# Patient Record
Sex: Female | Born: 1955 | Hispanic: No | Marital: Married | Smoking: Never smoker
Health system: Southern US, Community
[De-identification: ages and names within clinical notes are randomized; demographics above are authoritative.]

## PROBLEM LIST (undated history)

## (undated) DIAGNOSIS — K219 Gastro-esophageal reflux disease without esophagitis: Secondary | ICD-10-CM

## (undated) DIAGNOSIS — J45909 Unspecified asthma, uncomplicated: Secondary | ICD-10-CM

## (undated) DIAGNOSIS — I10 Essential (primary) hypertension: Secondary | ICD-10-CM

## (undated) HISTORY — DX: Gastro-esophageal reflux disease without esophagitis: K21.9

---

## 2003-09-18 ENCOUNTER — Other Ambulatory Visit: Admission: RE | Admit: 2003-09-18 | Discharge: 2003-09-18 | Payer: Self-pay | Admitting: Gynecology

## 2005-09-22 ENCOUNTER — Other Ambulatory Visit: Admission: RE | Admit: 2005-09-22 | Discharge: 2005-09-22 | Payer: Self-pay | Admitting: Gynecology

## 2007-10-31 ENCOUNTER — Emergency Department (HOSPITAL_COMMUNITY): Admission: EM | Admit: 2007-10-31 | Discharge: 2007-10-31 | Payer: Self-pay | Admitting: Emergency Medicine

## 2008-04-25 ENCOUNTER — Ambulatory Visit: Payer: Self-pay | Admitting: Cardiology

## 2008-05-19 ENCOUNTER — Ambulatory Visit: Payer: Self-pay | Admitting: Cardiology

## 2008-05-19 LAB — CONVERTED CEMR LAB
Chloride: 101 meq/L (ref 96–112)
GFR calc Af Amer: 85 mL/min
GFR calc non Af Amer: 70 mL/min
Hgb A1c MFr Bld: 5.7 % (ref 4.6–6.0)
LDL Cholesterol: 91 mg/dL (ref 0–99)
Potassium: 3.5 meq/L (ref 3.5–5.1)
Sodium: 139 meq/L (ref 135–145)
TSH: 0.86 microintl units/mL (ref 0.35–5.50)
Total CHOL/HDL Ratio: 2.6
VLDL: 17 mg/dL (ref 0–40)

## 2008-05-22 ENCOUNTER — Encounter: Payer: Self-pay | Admitting: Cardiology

## 2008-05-22 ENCOUNTER — Ambulatory Visit: Payer: Self-pay

## 2009-12-26 ENCOUNTER — Encounter: Payer: Self-pay | Admitting: Gastroenterology

## 2009-12-30 ENCOUNTER — Telehealth: Payer: Self-pay | Admitting: Gastroenterology

## 2009-12-30 ENCOUNTER — Ambulatory Visit: Payer: Self-pay | Admitting: Gastroenterology

## 2009-12-30 DIAGNOSIS — K921 Melena: Secondary | ICD-10-CM | POA: Insufficient documentation

## 2009-12-30 DIAGNOSIS — D509 Iron deficiency anemia, unspecified: Secondary | ICD-10-CM | POA: Insufficient documentation

## 2010-01-01 LAB — CONVERTED CEMR LAB
Ferritin: 4.6 ng/mL — ABNORMAL LOW (ref 10.0–291.0)
Transferrin: 349.5 mg/dL (ref 212.0–360.0)
Vitamin B-12: 513 pg/mL (ref 211–911)

## 2010-01-21 ENCOUNTER — Encounter: Admission: RE | Admit: 2010-01-21 | Discharge: 2010-01-21 | Payer: Self-pay | Admitting: Allergy and Immunology

## 2010-06-08 NOTE — Letter (Signed)
Summary: [facility]  [facility]   Imported By: Sherian Rein 01/04/2010 09:28:17  _____________________________________________________________________  External Attachment:    Type:   Image     Comment:   External Document

## 2010-06-08 NOTE — Letter (Signed)
Summary: [facility] Instructions  [facility]  [address] [place], [place] [address]   Phone: [phone]  Fax: [phone]       Lori [facility]    Apr 08, 1969    MRN: [id number]        Procedure Day /Date: Friday August 26th, 2011     Arrival Time: 10:30am     Procedure Time: 11:30am     Location of Procedure:                    _ x_  [facility] (4th Floor)                        PREPARATION FOR COLONOSCOPY WITH MOVIPREP   Starting 5 days prior to your procedure today do not eat nuts, seeds, popcorn, corn, beans, peas,  salads, or any raw vegetables.  Do not take any fiber supplements (e.g. Metamucil, Citrucel, and Benefiber).  THE DAY BEFORE YOUR PROCEDURE         DATE: 12/31/09  DAY: Thursday  1.  Drink clear liquids the entire day-NO SOLID FOOD  2.  Do not drink anything colored red or purple.  Avoid juices with pulp.  No orange juice.  3.  Drink at least 64 oz. (8 glasses) of fluid/clear liquids during the day to prevent dehydration and help the prep work efficiently.  CLEAR LIQUIDS INCLUDE: Water Jello Ice Popsicles Tea (sugar ok, no milk/cream) Powdered fruit flavored drinks Coffee (sugar ok, no milk/cream) Gatorade Juice: apple, white grape, white cranberry  Lemonade Clear bullion, consomm, broth Carbonated beverages (any kind) Strained chicken noodle soup Hard Candy                             4.  In the morning, mix first dose of MoviPrep solution:    Empty 1 Pouch A and 1 Pouch B into the disposable container    Add lukewarm drinking water to the top line of the container. Mix to dissolve    Refrigerate (mixed solution should be used within 24 hrs)  5.  Begin drinking the prep at 5:00 p.m. The MoviPrep container is divided by 4 marks.   Every 15 minutes drink the solution down to the next mark (approximately 8 oz) until the full liter is complete.   6.  Follow completed prep with 16 oz of clear liquid of your  choice (Nothing red or purple).  Continue to drink clear liquids until bedtime.  7.  Before going to bed, mix second dose of MoviPrep solution:    Empty 1 Pouch A and 1 Pouch B into the disposable container    Add lukewarm drinking water to the top line of the container. Mix to dissolve    Refrigerate  THE DAY OF YOUR PROCEDURE      DATE: 01/01/10 DAY: Friday  Beginning at 6:30 a.m. (5 hours before procedure):         1. Every 15 minutes, drink the solution down to the next mark (approx 8 oz) until the full liter is complete.  2. Follow completed prep with 16 oz. of clear liquid of your choice.    3. You may drink clear liquids until 9:30am (2 HOURS BEFORE PROCEDURE).   MEDICATION INSTRUCTIONS  Unless otherwise instructed, you should take regular prescription medications with a small sip of water   as early as possible the morning of your  procedure.       OTHER INSTRUCTIONS  You will need a responsible adult at least 53 years of age to accompany you and drive you home.   This person must remain in the waiting room during your procedure.  Wear loose fitting clothing that is easily removed.  Leave jewelry and other valuables at home.  However, you Schlotter wish to bring a book to read or  an iPod/MP3 player to listen to music as you wait for your procedure to start.  Remove all body piercing jewelry and leave at home.  Total time from sign-in until discharge is approximately 2-3 hours.  You should go home directly after your procedure and rest.  You can resume normal activities the  day after your procedure.  The day of your procedure you should not:   Drive   Make legal decisions   Operate machinery   Drink alcohol   Return to work  You will receive specific instructions about eating, activities and medications before you leave.    The above instructions have been reviewed and explained to me by   Marchelle Folks.     I fully understand and can verbalize these  instructions _____________________________ Date _________

## 2010-06-08 NOTE — Assessment & Plan Note (Signed)
Summary: BLOOD IN STOOL...AS.   History of Present Illness Visit Type: Initial Consult Primary GI MD: Elie Goody MD [facility] Primary Yul Diana: Lissa Merlin, MD Requesting Loretta Kluender: Lissa Merlin, MD Chief Complaint: Patient c/o increased belching and bloating which began on Friday. Also on Friday, patient began having nausea while having bowel movements. She has had several episodes of diarrhea and small amounts of brbpr. There was some mucous in her stool. History of Present Illness:   This is a 55 year old female, who noted the acute onset of blood and diarrhea last Friday. She had several episodes of diarrhea, followed by bloody diarrhea. Her symptoms abated after about 48 hours. She notes mid abdominal cramping with a bowel movement last night and that the bleeding and diarrhea have stopped. She was seen in an urgent care center with a hemoglobin of 11.7, and an MCV of 74. She frequently chew ice.  She is maintained on Dexilant for control of acid reflux, which appears to be quite effectively controlled, and this is followed by Dr. Sharyn Lull. She does not have a regular primary physician.   GI Review of Systems    Reports acid reflux, belching, bloating, and  nausea.      Denies abdominal pain, chest pain, dysphagia with liquids, dysphagia with solids, heartburn, loss of appetite, vomiting, vomiting blood, weight loss, and  weight gain.      Reports change in bowel habits, diarrhea, and  rectal bleeding.     Denies anal fissure, black tarry stools, constipation, diverticulosis, fecal incontinence, heme positive stool, hemorrhoids, irritable bowel syndrome, jaundice, light color stool, liver problems, and  rectal pain. Preventive Screening-Counseling & Management  Alcohol-Tobacco     Smoking Status: quit  Caffeine-Diet-Exercise     Does Patient Exercise: no   Current Medications (verified): 1)  Alvesco 160 Mcg/act Aers (Ciclesonide) .... One Puff Two Times A Day 2)  Singulair  10 Mg Tabs (Montelukast Sodium) .... One Tablet By Mouth Once Daily 3)  Amlodipine Besylate 5 Mg Tabs (Amlodipine Besylate) .... One Tablet By Mouth Once Daily 4)  Vitamin C 1000 Mg Tabs (Ascorbic Acid) .... Take 1 Tablet By Mouth Once A Day 5)  Fish Oil 1000 Mg Caps (Omega-3 Fatty Acids) .... Take 1 Capsule By Mouth Once Daily 6)  Calcium-Magnesium-Zinc 333-133-8.3 Mg Tabs (Calcium-Magnesium-Zinc) .... Take 1 Tablet By Mouth Once A Day 7)  Dexilant 60 Mg Cpdr (Dexlansoprazole) .... One Tablet By Mouth Once Daily  Allergies (verified): No Known Drug Allergies  Past History:  Past Medical History: Asthma Obesity Hypertension Seasonal Allergies GERD  Past Surgical History: Unremarkable  Family History: No FH of Colon Cancer: Family History of Heart Disease: Grandmother  Social History: Alcohol Use - no Illicit Drug Use - no Patient is a former smoker. -stopped 35 years ago Patient does not get regular exercise.  Smoking Status:  quit Does Patient Exercise:  no  Review of Systems       The patient complains of sleeping problems.         The pertinent positives and negatives are noted as above and in the HPI. All other ROS were reviewed and were negative.  Vital Signs:  Patient profile:   55 year old female Height:      66 inches Weight:      233.13 pounds BMI:     37.76 BSA:     2.14 Pulse rate:   72 / minute Pulse rhythm:   regular BP sitting:   126 / 72  (  right arm) Cuff size:   regular  Vitals Entered By: Lamona Curl CMA Duncan Dull) (December 30, 2009 10:28 AM)  Physical Exam  General:  Well developed, well nourished, no acute distress. Obese.   Head:  Normocephalic and atraumatic. Eyes:  PERRLA, no icterus. Ears:  Normal auditory acuity. Mouth:  No deformity or lesions, dentition normal. Neck:  Supple; no masses or thyromegaly. Lungs:  Clear throughout to auscultation. Heart:  Regular rate and rhythm; no murmurs, rubs,  or bruits. Abdomen:  Soft,  nontender and nondistended. No masses, hepatosplenomegaly or hernias noted. Normal bowel sounds. Rectal:  deferred until time of colonoscopy.   Msk:  Symmetrical with no gross deformities. Normal posture. Pulses:  Normal pulses noted. Extremities:  No clubbing, cyanosis, edema or deformities noted. Neurologic:  Alert and  oriented x4;  grossly normal neurologically. Cervical Nodes:  No significant cervical adenopathy. Inguinal Nodes:  No significant inguinal adenopathy. Psych:  Alert and cooperative. Normal mood and affect.  Impression & Recommendations:  Problem # 1:  BLOOD IN STOOL (ICD-578.1) Acute, self-limited bloody diarrhea. I suspect she had an infectious or ischemic colitis. Need to exclude colorectal neoplasms and IBD. She has not previously had colonoscopy for colorectal cancer screening. The risks, benefits and alternatives to colonoscopy with possible biopsy and possible polypectomy were discussed with the patient and they consent to proceed. The procedure will be scheduled electively. Orders: TLB-Iron, (Fe) Total (83540-FE) TLB-B12, Serum-Total ONLY (13086-V78) TLB-Ferritin (82728-FER) TLB-Folic Acid (Folate) (82746-FOL) TLB-IBC Pnl (Iron/FE;Transferrin) (83550-IBC) Colonoscopy (Colon)  Problem # 2:  ANEMIA, IRON DEFICIENCY (ICD-280.9) Microcytic anemia. Presumed iron deficiency. Rule out chronic gastrointestinal losses versus other causes. Plan as above.  Patient Instructions: 1)  Get your labs drawn today in the basement. 2)  Colonoscopy brochure given.  3)  Copy sent to : Lissa Merlin, MD 4)  The medication list was reviewed and reconciled.  All changed / newly prescribed medications were explained.  A complete medication list was provided to the patient / caregiver.  Prescriptions: MOVIPREP 100 GM  SOLR (PEG-KCL-NACL-NASULF-NA ASC-C) As per prep instructions.  #1 x 0   Entered by:   Christie Nottingham CMA (AAMA)   Authorized by:   Meryl Dare MD [facility]   Signed  by:   Meryl Dare MD [facility] on 12/30/2009   Method used:   Electronically to        [facility] (retail)       [address]       [place], [place]  [address]       Ph: [phone]       Fax: [phone]   RxID:   [id number]

## 2010-06-08 NOTE — Progress Notes (Signed)
Summary: cx fee?  Phone Note Call from Patient   Caller: Patient Call For: Dr. Russella Dar Summary of Call: pt came into office today to cancel COL scheduled for this Friday... reason being she doesn't have enough money to pay for the procedure... pt did not reschedule at this time Dr. Russella Dar, do you wish to charge pt a cx fee? Initial call taken by: Vallarie Mare,  December 30, 2009 11:33 AM  Follow-up for Phone Call        No Cx fee since she just scheduled today. Follow-up by: Meryl Dare MD Clementeen Graham,  December 30, 2009 11:36 AM  Additional Follow-up for Phone Call Additional follow up Details #1::        Patient NOT BILLED. Additional Follow-up by: Leanor Kail [facility],  December 31, 2009 8:53 AM

## 2010-08-11 ENCOUNTER — Ambulatory Visit: Payer: Self-pay | Admitting: Cardiovascular Disease

## 2010-09-21 NOTE — Procedures (Signed)
[facility]                              EXERCISE Zada Girt   Lori Newton, Lori Newton                   MRN:          [id number]  DATE:05/19/2008                            DOB:          1955/06/11    ASTHMA/ALLERGY SPECIALIST:  Jessica Priest, MD   INDICATION:  Chest pain.   EXERCISE TEST PROCEDURE:  The patient exercised according to the  standard Bruce protocol for 6 minutes and 20 seconds.  She achieved a  work level of 7.50 METS, resting heart rate 103 beats per minute rose to  maximal heart rate of 164 beats per minute, this represents 96% of the  maximal age predicted heart rate.  The resting blood pressure 116/60  reached a maximum pressure of 167/73.  The exercise test was stopped due  to shortness of breath.  There was no chest pain.  Evaluation of the  exercise EKG showed no significant ST-segment changes.  There were 2  brief runs of 3 beats of nonsustained ventricular tachycardia.   CONCLUSIONS:  Below normal exercise tolerance for age.  No evidence for  ischemia on the exercise EKG.  Mild amount of ventricular ectopy.  We  will be doing a Holter monitor and an echocardiogram on this patient.     Marca Ancona, MD  Electronically Signed    DM/MedQ  DD: 05/19/2008  DT: 05/20/2008  Job #: [id number]   cc:   Jessica Priest, M.D.

## 2010-09-21 NOTE — Assessment & Plan Note (Signed)
[facility]                            CARDIOLOGY OFFICE NOTE   Lori Newton, Lori Newton                   MRN:          [id number]  DATE:04/25/2008                            DOB:          March 30, 1956    REFERRING PHYSICIAN:  Jessica Priest, MD with [facility]  [place].   HISTORY OF PRESENT ILLNESS:  This is a 55 year old with a history of  asthma and gastroesophageal reflux disease, who presents for evaluation  of chest pain.  The patient states that she has had episodes of chest  pain periodically in the past.  Once in September 2009, she went the  emergency department with chest discomfort, was watched over a few hours  and then sent home and told that they did not think she had any heart  problems.  Over the last 2 weeks, she has began to get chest pain again.  She states that it feels like pressure is substernal and it is not  associated with exertion, and most often comes on when she is not doing  anything.  She has occasional episodes daily.  The episodes last only 2-  3 minutes and then resolves spontaneously.  She describes them as mild-  to-moderate in intensity.  These episodes she states are not associated  with either wheezing or shortness of breath.  She states typically when  she has asthma flares, she does not get chest pain, unless she is  coughing a lot.  She has not been coughing a lot over the last couple of  weeks.  Typically with asthma, she gets shortness of breath and wheezing  without having chest pain.  Therefore, she feels like this is somewhat  different than her usual pattern.  Additionally, the patient does have  gastroesophageal reflux disease, but she states that the reflux symptoms  that she gets tend to be more epigastric and associated with eating.  The patient has been walking for exercise lately.  She just started  doing this and she states that she does not get any particular chest  pain  when she walks for exercise.  The patient is premenopausal.  The  patient also sometimes feel always like her heart slows down too much  and when that happens she feels like her heart beats extremely hard.  She does not get lightheaded with these episodes and has not had any  syncope.   PAST MEDICAL HISTORY:  1. Asthma.  2. Allergic rhinitis.  3. Gastroesophageal reflux disease.  This is all to contribute to her      asthma.  4. Obesity.   MEDICATIONS:  1. Advair 500/50 b.i.d.  2. Nexium 40 b.i.d.  3. Albuterol p.r.n.   EKG was reviewed today shows normal sinus rhythm and is a normal EKG.   SOCIAL HISTORY:  The patient quit smoking 29 years ago.  She does not  use any significant alcohol or illicit drugs.   FAMILY HISTORY:  The patient has no history of premature coronary artery  disease.  There is entirely no history of MI in her family.  The  patient's father  died of bone cancer.  Mother is alive and very healthy.   REVIEW OF SYSTEMS:  Negative except as noted in the history of present  illness.   PHYSICAL EXAMINATION:  VITAL SIGNS:  Blood pressure is 131/90, heart  rate is 85 and regular, and weight is 227 pounds.  GENERAL:  This is a obese female and in no apparent distress.  NEUROLOGICAL:  Alert and oriented x3.  Normal affect.  HEENT:  Normal.  NECK:  There is no JVD.  There is no thyromegaly or thyroid nodule.  LUNGS:  Clear to auscultation bilaterally with normal respiratory  effort.  There is no wheezing.  CARDIOVASCULAR:  Heart regular.  S1 and S2.  No S3.  No S4.  There is no  murmur.  There is no peripheral edema.  There are 2+ posterior tibial  pulses bilaterally.  There is no carotid bruit.  ABDOMEN:  Obese, soft, and nontender.  No hepatosplenomegaly.  Normal  bowel sounds.  EXTREMITIES:  No clubbing or cyanosis.  MUSCULOSKELETAL:  Normal exam.  SKIN:  Normal exam.   ASSESSMENT AND PLAN:  This is a 54 year old with history of asthma and  gastroesophageal  reflux disease, who presents for evaluation of chest  pain.  1. Chest pain.  The patient's chest pain symptoms are quite atypical.      She really does not appear to have any significant risk factors for      coronary disease.  Her blood pressure is borderline at 131/90, but      I do not think that she actually has the true diagnosis of      hypertension.  She is a nonsmoker.  We do not know what her      cholesterol is.  She has no family history of premature coronary      artery disease.  She is premenopausal.  Her chest pain may very      well be function of either her asthma or her gastroesophageal      reflux disease.  She does not remember using her albuterol inhaler      during an episode of chest pain in the past, so she is not sure      that if using albuterol inhaler would actually help it.  I did tell      the patient to try using her albuterol when she has a chest pain      episode.  She has a normal EKG.  We will go ahead and do an      exercise treadmill test with no other imaging to make sure there is      no evidence for ischemia.  We will also check her cholesterol and      her hemoglobin A1c today.  2. Slow heart rate.  The patient feels like her heart rate slows down      too much of times.  She is not really symptomatic other than      feeling palpitations.  Her heart rate is in the 80s today.  Given      the fact that this is worrying her, I will set her up with a 48-      hour Holter monitor to make sure she does not have any significant      dysrhythmias.  We will also check a TSH.     Marca Ancona, MD  Electronically Signed    DM/MedQ  DD: 04/25/2008  DT: 04/26/2008  Job #: [id number]  cc:   Jessica Priest, M.D.

## 2010-11-28 ENCOUNTER — Emergency Department (HOSPITAL_COMMUNITY)
Admission: EM | Admit: 2010-11-28 | Discharge: 2010-11-29 | Disposition: A | Discharge status: Home / Self Care | Payer: BC Managed Care – PPO | Attending: Emergency Medicine | Admitting: Emergency Medicine

## 2010-11-28 DIAGNOSIS — K219 Gastro-esophageal reflux disease without esophagitis: Secondary | ICD-10-CM | POA: Insufficient documentation

## 2010-11-28 DIAGNOSIS — R42 Dizziness and giddiness: Secondary | ICD-10-CM | POA: Insufficient documentation

## 2010-11-28 DIAGNOSIS — R5383 Other fatigue: Secondary | ICD-10-CM | POA: Insufficient documentation

## 2010-11-28 DIAGNOSIS — I10 Essential (primary) hypertension: Secondary | ICD-10-CM | POA: Insufficient documentation

## 2010-11-28 DIAGNOSIS — Z79899 Other long term (current) drug therapy: Secondary | ICD-10-CM | POA: Insufficient documentation

## 2010-11-28 DIAGNOSIS — R5381 Other malaise: Secondary | ICD-10-CM | POA: Insufficient documentation

## 2010-11-28 DIAGNOSIS — J45909 Unspecified asthma, uncomplicated: Secondary | ICD-10-CM | POA: Insufficient documentation

## 2010-11-28 DIAGNOSIS — R002 Palpitations: Secondary | ICD-10-CM | POA: Insufficient documentation

## 2010-11-28 LAB — POCT I-STAT, CHEM 8
Calcium, Ion: 1.19 mmol/L (ref 1.12–1.32)
Chloride: 104 mEq/L (ref 96–112)
Creatinine, Ser: 0.9 mg/dL (ref 0.50–1.10)
Glucose, Bld: 87 mg/dL (ref 70–99)
Hemoglobin: 13.6 g/dL (ref 12.0–15.0)
Potassium: 3.9 mEq/L (ref 3.5–5.1)

## 2010-11-28 LAB — DIFFERENTIAL
Basophils Absolute: 0 10*3/uL (ref 0.0–0.1)
Eosinophils Relative: 1 % (ref 0–5)
Lymphocytes Relative: 29 % (ref 12–46)
Lymphs Abs: 1.5 10*3/uL (ref 0.7–4.0)
Monocytes Absolute: 0.5 10*3/uL (ref 0.1–1.0)
Monocytes Relative: 10 % (ref 3–12)
Neutro Abs: 3.2 10*3/uL (ref 1.7–7.7)

## 2010-11-28 LAB — CBC
HCT: 38.3 % (ref 36.0–46.0)
Hemoglobin: 12.8 g/dL (ref 12.0–15.0)
MCHC: 33.4 g/dL (ref 30.0–36.0)
MCV: 86.7 fL (ref 78.0–100.0)

## 2010-12-06 ENCOUNTER — Ambulatory Visit: Payer: Self-pay | Admitting: Internal Medicine

## 2010-12-08 ENCOUNTER — Emergency Department (HOSPITAL_COMMUNITY)
Admission: EM | Admit: 2010-12-08 | Discharge: 2010-12-08 | Disposition: A | Discharge status: Home / Self Care | Payer: BC Managed Care – PPO | Attending: Emergency Medicine | Admitting: Emergency Medicine

## 2010-12-08 DIAGNOSIS — I10 Essential (primary) hypertension: Secondary | ICD-10-CM | POA: Insufficient documentation

## 2010-12-08 DIAGNOSIS — Z79899 Other long term (current) drug therapy: Secondary | ICD-10-CM | POA: Insufficient documentation

## 2011-02-03 LAB — POCT I-STAT, CHEM 8
Calcium, Ion: 1.16
Chloride: 104
Creatinine, Ser: 0.9
Glucose, Bld: 90
Potassium: 3.7

## 2011-02-03 LAB — POCT CARDIAC MARKERS
CKMB, poc: 1.6
Troponin i, poc: <0.05

## 2012-02-01 ENCOUNTER — Emergency Department (HOSPITAL_COMMUNITY)
Admission: EM | Admit: 2012-02-01 | Discharge: 2012-02-01 | Disposition: A | Discharge status: Home / Self Care | Payer: BC Managed Care – PPO | Attending: Emergency Medicine | Admitting: Emergency Medicine

## 2012-02-01 ENCOUNTER — Encounter (HOSPITAL_COMMUNITY): Payer: Self-pay | Admitting: Emergency Medicine

## 2012-02-01 DIAGNOSIS — R5381 Other malaise: Secondary | ICD-10-CM | POA: Insufficient documentation

## 2012-02-01 DIAGNOSIS — Z9109 Other allergy status, other than to drugs and biological substances: Secondary | ICD-10-CM | POA: Insufficient documentation

## 2012-02-01 DIAGNOSIS — R531 Weakness: Secondary | ICD-10-CM

## 2012-02-01 DIAGNOSIS — I10 Essential (primary) hypertension: Secondary | ICD-10-CM | POA: Insufficient documentation

## 2012-02-01 DIAGNOSIS — Z79899 Other long term (current) drug therapy: Secondary | ICD-10-CM | POA: Insufficient documentation

## 2012-02-01 DIAGNOSIS — J45909 Unspecified asthma, uncomplicated: Secondary | ICD-10-CM | POA: Insufficient documentation

## 2012-02-01 HISTORY — DX: Unspecified asthma, uncomplicated: J45.909

## 2012-02-01 HISTORY — DX: Essential (primary) hypertension: I10

## 2012-02-01 LAB — URINALYSIS, ROUTINE W REFLEX MICROSCOPIC
Leukocytes, UA: NEGATIVE
Nitrite: NEGATIVE
Specific Gravity, Urine: 1.007 (ref 1.005–1.030)
Urobilinogen, UA: 0.2 mg/dL (ref 0.0–1.0)

## 2012-02-01 LAB — CBC WITH DIFFERENTIAL/PLATELET
Basophils Absolute: 0 10*3/uL (ref 0.0–0.1)
Basophils Relative: 0 % (ref 0–1)
Eosinophils Absolute: 0.1 10*3/uL (ref 0.0–0.7)
Eosinophils Relative: 1 % (ref 0–5)
HCT: 41.7 % (ref 36.0–46.0)
Hemoglobin: 14.4 g/dL (ref 12.0–15.0)
Lymphocytes Relative: 34 % (ref 12–46)
Lymphs Abs: 1.8 10*3/uL (ref 0.7–4.0)
MCH: 30.3 pg (ref 26.0–34.0)
MCHC: 34.5 g/dL (ref 30.0–36.0)
MCV: 87.8 fL (ref 78.0–100.0)
Monocytes Absolute: 0.5 10*3/uL (ref 0.1–1.0)
Monocytes Relative: 9 % (ref 3–12)
Neutro Abs: 3 10*3/uL (ref 1.7–7.7)
Neutrophils Relative %: 56 % (ref 43–77)
Platelets: 341 10*3/uL (ref 150–400)
RBC: 4.75 MIL/uL (ref 3.87–5.11)
RDW: 13 % (ref 11.5–15.5)
WBC: 5.5 10*3/uL (ref 4.0–10.5)

## 2012-02-01 LAB — BASIC METABOLIC PANEL
CO2: 28 mEq/L (ref 19–32)
GFR calc non Af Amer: 78 mL/min — ABNORMAL LOW (ref >90)
Glucose, Bld: 99 mg/dL (ref 70–99)
Potassium: 3.6 mEq/L (ref 3.5–5.1)
Sodium: 138 mEq/L (ref 135–145)

## 2012-02-01 NOTE — ED Provider Notes (Signed)
History     CSN: [id number]  Arrival date & time 02/01/12  1357   First MD Initiated Contact with Patient 02/01/12 1704      Chief Complaint  Patient presents with  . Weakness    (Consider location/radiation/quality/duration/timing/severity/associated sxs/prior treatment) HPI... weakness upon awakening from a nap this afternoon .  Her legs felt heavy, she was weak and dizzy. Had only eaten bag of potato chips earlier today. Feels back to normal now. No chest pain, shortness of breath, neurological deficits.  Severity is mild. Nothing makes symptoms better or worse  Past Medical History  Diagnosis Date  . Hypertension   . Asthma     History reviewed. No pertinent past surgical history.  History reviewed. No pertinent family history.  History  Substance Use Topics  . Smoking status: Never Smoker   . Smokeless tobacco: Not on file  . Alcohol Use: Yes    OB History    Grav Para Term Preterm Abortions TAB SAB Ect Mult Living                  Review of Systems  All other systems reviewed and are negative.    Allergies  Betadine  Home Medications   Current Outpatient Rx  Name Route Sig Dispense Refill  . ALBUTEROL SULFATE HFA 108 (90 BASE) MCG/ACT IN AERS Inhalation Inhale 2 puffs into the lungs every 6 (six) hours as needed. wheezing    . BECLOMETHASONE DIPROPIONATE 80 MCG/ACT IN AERS Inhalation Inhale 2 puffs into the lungs as needed.    Marland Kitchen CALCIUM CARBONATE 1250 MG PO CHEW Oral Chew 1 tablet by mouth daily.    Marland Kitchen VITAMIN D 1000 UNITS PO TABS Oral Take 1,000 Units by mouth daily.    . DEXLANSOPRAZOLE 60 MG PO CPDR Oral Take 60 mg by mouth daily.    Marland Kitchen DILTIAZEM HCL ER 240 MG PO CP24 Oral Take 240 mg by mouth daily.    Marland Kitchen HYDROCHLOROTHIAZIDE 12.5 MG PO TABS Oral Take 12.5 mg by mouth daily.      BP 134/78  Pulse 87  Temp 98.2 F (36.8 C) (Oral)  Resp 20  SpO2 97%  LMP 01/01/2011  Physical Exam  Nursing note and vitals reviewed. Constitutional: She is  oriented to person, place, and time. She appears well-developed and well-nourished.  HENT:  Head: Normocephalic and atraumatic.  Eyes: Conjunctivae normal and EOM are normal. Pupils are equal, round, and reactive to light.  Neck: Normal range of motion. Neck supple.  Cardiovascular: Normal rate, regular rhythm and normal heart sounds.   Pulmonary/Chest: Effort normal and breath sounds normal.  Abdominal: Soft. Bowel sounds are normal.  Musculoskeletal: Normal range of motion.  Neurological: She is alert and oriented to person, place, and time.  Skin: Skin is warm and dry.  Psychiatric: She has a normal mood and affect.    ED Course  Procedures (including critical care time)  Labs Reviewed  BASIC METABOLIC PANEL - Abnormal; Notable for the following:    GFR calc non Af Amer 78 (*)     All other components within normal limits  URINALYSIS, ROUTINE W REFLEX MICROSCOPIC  CBC WITH DIFFERENTIAL   No results found.   1. Weakness       MDM  Normal physical exam. Labs, urinalysis normal. No evidence of neurological injury        Donnetta Hutching, MD 02/01/12 1844

## 2012-02-01 NOTE — ED Notes (Signed)
Pt states that she normally takes a nap everyday but today when she woke up she felt lightheaded and that her legs were heavy.  Denies any pain.  No other complaints.  VSS.

## 2015-03-03 ENCOUNTER — Ambulatory Visit (INDEPENDENT_AMBULATORY_CARE_PROVIDER_SITE_OTHER): Payer: 59 | Admitting: Allergy and Immunology

## 2015-03-03 ENCOUNTER — Encounter: Payer: Self-pay | Admitting: Allergy and Immunology

## 2015-03-03 VITALS — BP 142/92 | HR 68 | Resp 16

## 2015-03-03 DIAGNOSIS — J4530 Mild persistent asthma, uncomplicated: Secondary | ICD-10-CM

## 2015-03-03 DIAGNOSIS — K219 Gastro-esophageal reflux disease without esophagitis: Secondary | ICD-10-CM

## 2015-03-03 DIAGNOSIS — J3089 Other allergic rhinitis: Secondary | ICD-10-CM

## 2015-03-03 MED ORDER — ALBUTEROL SULFATE 108 (90 BASE) MCG/ACT IN AEPB: 2.0000 | INHALATION_SPRAY | RESPIRATORY_TRACT | Status: DC | PRN

## 2015-03-03 NOTE — Progress Notes (Signed)
[facility]  Follow-up Note  Refering Provider: No ref. provider found PCP: Farris Has, MD  Subjective:   Lori Newton is a 59 y.o. female who [facility] the [facility] in re-evaluation of the following:  HPI Comments:  Lori Newton returns to this clinic after a hiatus of 2-1/2 years. She is done very well with her asthma. She's had no exacerbations requiring her to get steroids. She's had no hospitalizations. She has nocturnal bronchospastic symptoms one time per month. Otherwise she has no need to use a short-acting bronchodilator. She is using Qvar 80 one inhalation in the morning and 2 inhalations in the evening and continues on montelukast 10 mg daily. She finds that when she stops montelukast she has more problems. Her nose has not been causing her problem. She has not had any reflux issues. On a rare occasion, averaging out one time per month, she will take apple cider vinegar to treat a reflux event.   Outpatient Prescriptions Prior to Visit  Medication Sig  . calcium carbonate (OS-CAL) 1250 MG chewable tablet Chew 1 tablet by mouth daily.  . cholecalciferol (VITAMIN D) 1000 UNITS tablet Take 1,000 Units by mouth daily.  Marland Kitchen diltiazem (DILACOR XR) 240 MG 24 hr capsule Take 240 mg by mouth daily.  Marland Kitchen albuterol (PROVENTIL HFA;VENTOLIN HFA) 108 (90 BASE) MCG/ACT inhaler Inhale 2 puffs into the lungs every 6 (six) hours as needed. wheezing  . dexlansoprazole (DEXILANT) 60 MG capsule Take 60 mg by mouth daily.  . hydrochlorothiazide (HYDRODIURIL) 12.5 MG tablet Take 12.5 mg by mouth daily.   No facility-administered medications prior to visit.    Meds ordered this encounter  Medications  . Albuterol Sulfate (PROAIR RESPICLICK) 108 (90 BASE) MCG/ACT AEPB    Sig: Inhale 2 Doses into the lungs every 4 (four) hours as needed.    Dispense:  1 each    Refill:  1    Pt has coupon for free drug    Past Medical  History  Diagnosis Date  . Hypertension   . Asthma   . GERD (gastroesophageal reflux disease)     History reviewed. No pertinent past surgical history.  Allergies  Allergen Reactions  . Betadine [Povidone Iodine] Rash    Review of Systems  Constitutional: Negative for fever, chills, weight loss and malaise/fatigue.  HENT: Negative for congestion, ear discharge, ear pain, hearing loss, nosebleeds, sore throat and tinnitus.   Eyes: Negative for blurred vision, pain, discharge and redness.  Respiratory: Negative for cough, hemoptysis, sputum production, shortness of breath, wheezing and stridor.   Cardiovascular: Negative for chest pain, palpitations and leg swelling.  Gastrointestinal: Negative for heartburn, nausea, vomiting, abdominal pain and diarrhea.  Genitourinary: Negative for dysuria.  Musculoskeletal: Positive for joint pain. Negative for myalgias.       Lori Newton has developed problems with right hip pain, left shoulder pain, and foot pain. These have been long-standing issues but may be a little bit worse the past year or so. She is scheduled to see a foot doctor  Skin: Negative for itching and rash.  Neurological: Negative for dizziness and headaches.     Objective:   Filed Vitals:   03/03/15 1109  BP: 142/92  Pulse: 68  Resp: 16          Physical Exam  Constitutional: She is well-developed, well-nourished, and in no distress. No distress.  HENT:  Head: Normocephalic and atraumatic. Head is without right periorbital  erythema and without left periorbital erythema.  Right Ear: Tympanic membrane, external ear and ear canal normal. No drainage. No foreign bodies. Tympanic membrane is not injected, not scarred, not perforated, not erythematous, not retracted and not bulging. No middle ear effusion.  Left Ear: Tympanic membrane, external ear and ear canal normal. No drainage. No foreign bodies. Tympanic membrane is not injected, not scarred, not perforated, not  erythematous, not retracted and not bulging.  No middle ear effusion.  Nose: Nose normal. No mucosal edema, rhinorrhea, nose lacerations, sinus tenderness, nasal deformity, septal deviation or nasal septal hematoma. No epistaxis.  Mouth/Throat: Oropharynx is clear and moist and mucous membranes are normal. No oropharyngeal exudate, posterior oropharyngeal edema, posterior oropharyngeal erythema or tonsillar abscesses.  Eyes: Conjunctivae and lids are normal. Pupils are equal, round, and reactive to light. Right eye exhibits no discharge and no exudate. No foreign body present in the right eye. Left eye exhibits no discharge and no exudate. No foreign body present in the left eye. Right conjunctiva is not injected. Right conjunctiva has no hemorrhage. Left conjunctiva is not injected. Left conjunctiva has no hemorrhage. No scleral icterus.  Neck: No tracheal tenderness present. No tracheal deviation present. No thyromegaly present.  Cardiovascular: Normal rate, regular rhythm, S1 normal, S2 normal and normal heart sounds.  Exam reveals no gallop and no friction rub.   No murmur heard. Pulmonary/Chest: Effort normal. No stridor. No respiratory distress. She has no wheezes. She has no rhonchi. She has no rales. She exhibits no tenderness.  Musculoskeletal: She exhibits no edema or tenderness.  Lymphadenopathy:    She has no cervical adenopathy.  Skin: No purpura and no rash noted. Rash is not macular, not maculopapular, not nodular, not pustular, not vesicular and not urticarial. She is not diaphoretic. No cyanosis or erythema. No pallor. Nails show no clubbing.  Psychiatric: Mood, affect and judgment normal.    Diagnostics:    Spirometry was performed and demonstrated an FEV1 of 1.97 at 90 % of predicted.  The patient had an Asthma Control Test with the following results: ACT Total Score: 21.    Assessment and Plan:   1. Mild persistent asthma, uncomplicated   2. Other allergic rhinitis   3.  Gastroesophageal reflux disease, esophagitis presence not specified      1. Qvar 80 one puff two times per day. Can increase to 3 puffs three times per day during 'asthma flare'  2. Continue montelukast 10mg  one tablet one time per day  3. Use Proair Respiclick (with coupon) 2 puffs every 4-6 hours if needed.  4. Return in one year   Lori Newton is done excellent. I see no need for changing her medical therapy other than to attempt a decrease in her Qvar dose. She will keep in contact with me noting her response to the therapy mentioned above. If she does well I will see her in his clinic in 1 year   Laurette SchimkeEric Shashwat Cleary, MD [facility]

## 2015-03-03 NOTE — Patient Instructions (Signed)
  1. Qvar 80 one puff two times per day. Can increase to 3 puffs three times per day during 'asthma flare'  2. Continue montelukast 10mg  one tablet one time per day  3. Use Proair Respiclick (with coupon) 2 puffs every 4-6 hours if needed.  4. Return in one year

## 2016-08-29 ENCOUNTER — Other Ambulatory Visit: Payer: Self-pay | Admitting: Family Medicine

## 2016-08-29 DIAGNOSIS — E049 Nontoxic goiter, unspecified: Secondary | ICD-10-CM

## 2016-09-05 ENCOUNTER — Other Ambulatory Visit: Payer: BC Managed Care – PPO

## 2019-02-26 LAB — CBC AND DIFFERENTIAL
HCT: 39 (ref 36–46)
Hemoglobin: 13.3 (ref 12.0–16.0)
Platelets: 341 (ref 150–399)

## 2019-02-26 LAB — TSH: TSH: 1.01 (ref <=5.90)

## 2019-02-26 LAB — CBC: RBC: 4.27 (ref 3.87–5.11)

## 2019-02-28 ENCOUNTER — Encounter: Payer: Self-pay | Admitting: Neurology

## 2019-04-09 ENCOUNTER — Ambulatory Visit: Payer: BC Managed Care – PPO | Admitting: Neurology

## 2019-07-31 NOTE — Progress Notes (Signed)
Cardiology Office Note:   Date:  08/01/2019  NAME:  Lori Newton    MRN: [id number] DOB:  [birth year]   PCP:  London Pepper, MD  Cardiologist:  No primary care provider on file.   Referring MD: London Pepper, MD   Chief Complaint  Patient presents with   New Patient (Initial Visit)   Palpitations   History of Present Illness:   Lori Newton is a 64 y.o. female with a hx of asthma, GERD who is being seen today for the evaluation of palpitations at the request of London Pepper, MD.  She reports for the past 3 months she is noted her heart beating fast at night.  She reports she is mainly aware of her heart beating but it can be fast.  She also reports she has pulsatile tinnitus.  This is improved with laying on her back.  She does report frequent napping as well as snoring.  She reports that when she gets home or during the day she can fall asleep and is interrupted her sleep schedule at night.  I informed her this to try to refrain from napping to improve her sleep quality.  I am also is concerned about sleep apnea.  Her BMI is 37.  She does snore.  She does screen positive for sleep apnea.  She denies chest pain or shortness of breath today.  She does not exercise routinely.  Her current level of activity includes cleaning the house and doing things around town.  She has no limitations doing that.  She does report her mother has a heart condition and takes Eliquis, the details are uncertain.  She reports no excess coffee or caffeine consumption.  She does get jittery when she drinks excess caffeine and she refrains from this.  She has history of hypertension for which she takes diltiazem.  Her blood pressure is 136/84 today.  I informed her that amlodipine would likely be a better agent for her.  She is amendable to this.  She does have hyperlipidemia which is not that bad.  Most recent lipid profile from 2018 shows total cholesterol 182, HDL 57, LDL 105, triglycerides 97.  No recent  thyroid studies.  Her father died of bone cancer.  No strong family history of heart disease.  She does not smoke and rarely consumes alcohol.  Past Medical History: Past Medical History:  Diagnosis Date   Asthma    GERD (gastroesophageal reflux disease)    Hypertension     Past Surgical History: Past Surgical History:  Procedure Laterality Date   CESAREAN SECTION      Current Medications: Current Meds  Medication Sig   albuterol (PROVENTIL HFA;VENTOLIN HFA) 108 (90 BASE) MCG/ACT inhaler Inhale 2 puffs into the lungs every 6 (six) hours as needed. wheezing   calcium carbonate (OS-CAL) 1250 MG chewable tablet Chew 1 tablet by mouth daily.   cholecalciferol (VITAMIN D) 1000 UNITS tablet Take 1,000 Units by mouth daily.   Coenzyme Q10 (CO Q 10) 100 MG CAPS Take 100 mg by mouth daily.   diltiazem (DILACOR XR) 240 MG 24 hr capsule Take 240 mg by mouth daily.   Flaxseed, Linseed, (FLAXSEED OIL PO) Take 1,000 mg by mouth daily.   Krill Oil 500 MG CAPS Take 500 mg by mouth daily.   Omega-3 Fatty Acids (FISH OIL) 500 MG CAPS Take 500 mg by mouth daily.   Turmeric (QC TUMERIC COMPLEX PO) Take 1 tablet by mouth daily.     Allergies:  Betadine [povidone iodine]   Social History: Social History   Socioeconomic History   Marital status: Married    Spouse name: Not on file   Number of children: 7   Years of education: Not on file   Highest education level: Not on file  Occupational History   Not on file  Tobacco Use   Smoking status: Never Smoker   Smokeless tobacco: Never Used  Substance and Sexual Activity   Alcohol use: Yes   Drug use: No   Sexual activity: Not on file  Other Topics Concern   Not on file  Social History Narrative   Not on file   Social Determinants of Health   Financial Resource Strain:    Difficulty of Paying Living Expenses:   Food Insecurity:    Worried About Programme researcher, broadcasting/film/video in the Last Year:    Barista in  the Last Year:   Transportation Needs:    Freight forwarder (Medical):    Lack of Transportation (Non-Medical):   Physical Activity:    Days of Exercise per Week:    Minutes of Exercise per Session:   Stress:    Feeling of Stress :   Social Connections:    Frequency of Communication with Friends and Family:    Frequency of Social Gatherings with Friends and Family:    Attends Religious Services:    Active Member of Clubs or Organizations:    Attends Banker Meetings:    Marital Status:      Family History: The patient's family history includes Arrhythmia in her mother; Cancer in her father.  ROS:   All other ROS reviewed and negative. Pertinent positives noted in the HPI.     EKGs/Labs/Other Studies Reviewed:   The following studies were personally reviewed by me today:  EKG:  EKG is ordered today.  The ekg ordered today demonstrates normal sinus rhythm, heart rate 80, no acute ST-T changes, no evidence of prior infarction, low voltage in limb leads, and was personally reviewed by me.   Recent Labs: No results found for requested labs within last 8760 hours.   Recent Lipid Panel    Component Value Date/Time   CHOL 173 05/19/2008 1011   TRIG 85 05/19/2008 1011   HDL 65.4 05/19/2008 1011   CHOLHDL 2.6 CALC 05/19/2008 1011   VLDL 17 05/19/2008 1011   LDLCALC 91 05/19/2008 1011    Physical Exam:   VS:  BP 136/84 (BP Location: Left Arm, Patient Position: Sitting, Cuff Size: Large)    Pulse 80    Ht 5\' 5"  (1.651 m)    Wt 225 lb (102.1 kg)    BMI 37.44 kg/m    Wt Readings from Last 3 Encounters:  08/01/19 225 lb (102.1 kg)    General: Well nourished, well developed, in no acute distress Heart: Atraumatic, normal size  Eyes: PEERLA, EOMI  Neck: Supple, no JVD Endocrine: No thryomegaly Cardiac: Normal S1, S2; RRR; no murmurs, rubs, or gallops Lungs: Clear to auscultation bilaterally, no wheezing, rhonchi or rales  Abd: Soft, nontender, no  hepatomegaly  Ext: No edema, pulses 2+ Musculoskeletal: No deformities, BUE and BLE strength normal and equal Skin: Warm and dry, no rashes   Neuro: Alert and oriented to person, place, time, and situation, CNII-XII grossly intact, no focal deficits  Psych: Normal mood and affect   ASSESSMENT:   Lori Slaght is a 64 y.o. female who presents for the following: 1. Palpitations  2. Fatigue, unspecified type   3. Snoring   4. Obesity (BMI 30-39.9)   5. Essential hypertension   6. Mixed hyperlipidemia     PLAN:   1. Palpitations -EKG today is normal.  We will check a TSH to exclude thyroid disease.  We will check a CBC to exclude anemia. BMP too. We will also proceed with an echocardiogram to ensure heart structurally normal.  I hear no murmurs rubs or gallops to suggest underlying heart disease.  Her examination is benign. -I am more concerned she may be having arrhythmias at night related to untreated sleep apnea.  See discussion about sleep apnea below.  We will obtain a sleep study.  2. Fatigue, unspecified type 3. Snoring 4. Obesity (BMI 30-39.9) -She does screen positive for sleep apnea.  Reports excessive fatigue during the day frequent snoring as well as frequent naps.  We will proceed with a home sleep study.  She will continue her diet and weight loss plan.  5. Essential hypertension -Blood pressure a bit elevated today.  She is on diltiazem.  We will plan to get her off diltiazem and put her on amlodipine.  This would be a better agent for her.  I did warn her about the rebound tachycardia with coming off diltiazem.  We will plan to give her 60 mg tablet short acting diltiazem for 3 days and then 30 mg tablet for 3 days and then stop.  6. Mixed hyperlipidemia -No recent lipid profile.  We will recheck this today.  Disposition: Return in about 3 months (around 11/01/2019).  Medication Adjustments/Labs and Tests Ordered: Current medicines are reviewed at length with the  patient today.  Concerns regarding medicines are outlined above.  No orders of the defined types were placed in this encounter.  No orders of the defined types were placed in this encounter.   Patient Instructions  Medication Instructions:  Start Amlodipine 10 mg daily  Take Diltiazem 30 mg 2 tablets for 3 days, 1 tablet for 3 days, then STOP.  *If you need a refill on your cardiac medications before your next appointment, please call your pharmacy*   Lab Work: TSH, LIPID today   If you have labs (blood work) drawn today and your tests are completely normal, you will receive your results only by:  MyChart Message (if you have MyChart) OR  A paper copy in the mail If you have any lab test that is abnormal or we need to change your treatment, we will call you to review the results.   Testing/Procedures: Echocardiogram - Your physician has requested that you have an echocardiogram. Echocardiography is a painless test that uses sound waves to create images of your heart. It provides your doctor with information about the size and shape of your heart and how well your hearts chambers and valves are working. This procedure takes approximately one hour. There are no restrictions for this procedure. This will be performed at our [facility] location - [address], [address].  Your physician has recommended that you have a sleep study. This test records several body functions during sleep, including: brain activity, eye movement, oxygen and carbon dioxide blood levels, heart rate and rhythm, breathing rate and rhythm, the flow of air through your mouth and nose, snoring, body muscle movements, and chest and belly movement.  Your physician has recommended that you wear a 7 DAY ZIO-PATCH monitor. The Zio patch cardiac monitor continuously records heart rhythm data for up to 14  days, this is for patients being evaluated for multiple types heart rhythms. For the first 24 hours post application,  please avoid getting the Zio monitor wet in the shower or by excessive sweating during exercise. After that, feel free to carry on with regular activities. Keep soaps and lotions away from the ZIO XT Patch.  This will be mailed to you, please expect 7-10 days to receive.          Follow-Up: At [facility], you and your health needs are our priority.  As part of our continuing mission to provide you with exceptional heart care, we have created designated Provider Care Teams.  These Care Teams include your primary Cardiologist (physician) and Advanced Practice Providers (APPs -  Physician Assistants and Nurse Practitioners) who all work together to provide you with the care you need, when you need it.  We recommend signing up for the patient portal called "MyChart".  Sign up information is provided on this After Visit Summary.  MyChart is used to connect with patients for Virtual Visits (Telemedicine).  Patients are able to view lab/test results, encounter notes, upcoming appointments, etc.  Non-urgent messages can be sent to your provider as well.   To learn more about what you can do with MyChart, go to [email or web address].    Your next appointment:   3 month(s)  The format for your next appointment:   In Person  Provider:   Lennie Odor, MD        Signed, Lenna Gilford. Flora Lipps, MD [facility]  [address], [address] [place], [place] [address] [phone]  08/01/2019 9:28 AM

## 2019-08-01 ENCOUNTER — Other Ambulatory Visit: Payer: Self-pay

## 2019-08-01 ENCOUNTER — Ambulatory Visit (INDEPENDENT_AMBULATORY_CARE_PROVIDER_SITE_OTHER): Payer: BC Managed Care – PPO | Admitting: Cardiovascular Disease

## 2019-08-01 ENCOUNTER — Telehealth: Payer: Self-pay | Admitting: Radiology

## 2019-08-01 ENCOUNTER — Encounter: Payer: Self-pay | Admitting: Cardiovascular Disease

## 2019-08-01 VITALS — BP 136/84 | HR 80 | Ht 65.0 in | Wt 225.0 lb

## 2019-08-01 DIAGNOSIS — E782 Mixed hyperlipidemia: Secondary | ICD-10-CM

## 2019-08-01 DIAGNOSIS — R5383 Other fatigue: Secondary | ICD-10-CM | POA: Diagnosis not present

## 2019-08-01 DIAGNOSIS — I10 Essential (primary) hypertension: Secondary | ICD-10-CM

## 2019-08-01 DIAGNOSIS — R0683 Snoring: Secondary | ICD-10-CM

## 2019-08-01 DIAGNOSIS — R002 Palpitations: Secondary | ICD-10-CM

## 2019-08-01 DIAGNOSIS — E669 Obesity, unspecified: Secondary | ICD-10-CM | POA: Diagnosis not present

## 2019-08-01 LAB — LIPID PANEL
Chol/HDL Ratio: 3.1 ratio (ref 0.0–4.4)
Cholesterol, Total: 185 mg/dL (ref 100–199)
HDL: 60 mg/dL (ref >39)
LDL Chol Calc (NIH): 104 mg/dL — ABNORMAL HIGH (ref 0–99)
Triglycerides: 119 mg/dL (ref 0–149)
VLDL Cholesterol Cal: 21 mg/dL (ref 5–40)

## 2019-08-01 LAB — TSH: TSH: 1.76 u[IU]/mL (ref 0.450–4.500)

## 2019-08-01 LAB — CBC
Hematocrit: 42.9 % (ref 34.0–46.6)
Hemoglobin: 14.5 g/dL (ref 11.1–15.9)
MCH: 30 pg (ref 26.6–33.0)
MCHC: 33.8 g/dL (ref 31.5–35.7)
MCV: 89 fL (ref 79–97)
Platelets: 312 10*3/uL (ref 150–450)
RBC: 4.84 x10E6/uL (ref 3.77–5.28)
RDW: 12.3 % (ref 11.7–15.4)
WBC: 5.5 10*3/uL (ref 3.4–10.8)

## 2019-08-01 LAB — BASIC METABOLIC PANEL
BUN/Creatinine Ratio: 11 — ABNORMAL LOW (ref 12–28)
BUN: 11 mg/dL (ref 8–27)
CO2: 24 mmol/L (ref 20–29)
Calcium: 9.7 mg/dL (ref 8.7–10.3)
Chloride: 100 mmol/L (ref 96–106)
Creatinine, Ser: 0.96 mg/dL (ref 0.57–1.00)
GFR calc Af Amer: 74 mL/min/{1.73_m2} (ref >59)
GFR calc non Af Amer: 64 mL/min/{1.73_m2} (ref >59)
Glucose: 88 mg/dL (ref 65–99)
Potassium: 4.2 mmol/L (ref 3.5–5.2)
Sodium: 140 mmol/L (ref 134–144)

## 2019-08-01 MED ORDER — DILTIAZEM HCL 30 MG PO TABS: ORAL_TABLET | ORAL | 0 refills | Status: DC

## 2019-08-01 MED ORDER — AMLODIPINE BESYLATE 10 MG PO TABS: 10.0000 mg | ORAL_TABLET | Freq: Every day | ORAL | 3 refills | Status: DC

## 2019-08-01 NOTE — Telephone Encounter (Signed)
Enrolled patient for a 7 day Zio monitor to be mailed to patients home.  

## 2019-08-01 NOTE — Patient Instructions (Addendum)
Medication Instructions:  Start Amlodipine 10 mg daily  Take Diltiazem 30 mg 2 tablets for 3 days, 1 tablet for 3 days, then STOP.  *If you need a refill on your cardiac medications before your next appointment, please call your pharmacy*   Lab Work: TSH, LIPID, CBC, BMET today   If you have labs (blood work) drawn today and your tests are completely normal, you will receive your results only by: Marland Kitchen MyChart Message (if you have MyChart) OR . A paper copy in the mail If you have any lab test that is abnormal or we need to change your treatment, we will call you to review the results.   Testing/Procedures: Echocardiogram - Your physician has requested that you have an echocardiogram. Echocardiography is a painless test that uses sound waves to create images of your heart. It provides your doctor with information about the size and shape of your heart and how well your heart's chambers and valves are working. This procedure takes approximately one hour. There are no restrictions for this procedure. This will be performed at our [facility] location - [address].  Your physician has recommended that you have a sleep study. This test records several body functions during sleep, including: brain activity, eye movement, oxygen and carbon dioxide blood levels, heart rate and rhythm, breathing rate and rhythm, the flow of air through your mouth and nose, snoring, body muscle movements, and chest and belly movement.  Your physician has recommended that you wear a 7 DAY ZIO-PATCH monitor. The Zio patch cardiac monitor continuously records heart rhythm data for up to 14 days, this is for patients being evaluated for multiple types heart rhythms. For the first 24 hours post application, please avoid getting the Zio monitor wet in the shower or by excessive sweating during exercise. After that, feel free to carry on with regular activities. Keep soaps and lotions away from the ZIO XT Patch.   This will be mailed to you, please expect 7-10 days to receive.          Follow-Up: At [facility], you and your health needs are our priority.  As part of our continuing mission to provide you with exceptional heart care, we have created designated Provider Care Teams.  These Care Teams include your primary Cardiologist (physician) and Advanced Practice Providers (APPs -  Physician Assistants and Nurse Practitioners) who all work together to provide you with the care you need, when you need it.  We recommend signing up for the patient portal called "MyChart".  Sign up information is provided on this After Visit Summary.  MyChart is used to connect with patients for Virtual Visits (Telemedicine).  Patients are able to view lab/test results, encounter notes, upcoming appointments, etc.  Non-urgent messages can be sent to your provider as well.   To learn more about what you can do with MyChart, go to [email or web address].    Your next appointment:   3 month(s)  The format for your next appointment:   Virtual Visit   Provider:   Lennie Odor, MD

## 2019-08-06 ENCOUNTER — Other Ambulatory Visit (INDEPENDENT_AMBULATORY_CARE_PROVIDER_SITE_OTHER): Payer: BC Managed Care – PPO

## 2019-08-06 DIAGNOSIS — R002 Palpitations: Secondary | ICD-10-CM

## 2019-08-19 ENCOUNTER — Other Ambulatory Visit (HOSPITAL_COMMUNITY): Payer: BC Managed Care – PPO

## 2019-08-21 ENCOUNTER — Other Ambulatory Visit: Payer: Self-pay

## 2019-08-21 ENCOUNTER — Ambulatory Visit (HOSPITAL_COMMUNITY): Payer: BC Managed Care – PPO | Attending: Cardiovascular Disease

## 2019-08-21 DIAGNOSIS — R002 Palpitations: Secondary | ICD-10-CM | POA: Insufficient documentation

## 2019-10-20 ENCOUNTER — Telehealth: Payer: Self-pay | Admitting: *Deleted

## 2019-10-20 NOTE — Telephone Encounter (Signed)
PA requested to [facility] for home sleep study via web portal. No PA required.

## 2019-10-21 ENCOUNTER — Telehealth: Payer: Self-pay | Admitting: *Deleted

## 2019-10-21 NOTE — Telephone Encounter (Signed)
Left HST appointment on VM. [facility] contact information left if she has any questions.

## 2019-11-01 ENCOUNTER — Telehealth: Payer: Self-pay | Admitting: Cardiovascular Disease

## 2019-11-01 NOTE — Telephone Encounter (Signed)
I attempted to contact patient to schedule follow up visit on 11/01/19 from patients recall list. The patient didn't answer, left message for patient to return call to get appt scheduled.

## 2019-11-07 ENCOUNTER — Telehealth: Payer: Self-pay | Admitting: *Deleted

## 2019-11-07 NOTE — Telephone Encounter (Signed)
Per Mrs.Krahenbuhl,recall removed she will call us if needed

## 2019-11-30 ENCOUNTER — Other Ambulatory Visit: Payer: Self-pay

## 2019-11-30 ENCOUNTER — Emergency Department (HOSPITAL_COMMUNITY)
Admission: EM | Admit: 2019-11-30 | Discharge: 2019-11-30 | Disposition: A | Discharge status: Home / Self Care | Payer: BC Managed Care – PPO | Attending: Emergency Medicine | Admitting: Emergency Medicine

## 2019-11-30 ENCOUNTER — Encounter (HOSPITAL_COMMUNITY): Payer: Self-pay | Admitting: Emergency Medicine

## 2019-11-30 DIAGNOSIS — J45909 Unspecified asthma, uncomplicated: Secondary | ICD-10-CM | POA: Diagnosis not present

## 2019-11-30 DIAGNOSIS — S61209A Unspecified open wound of unspecified finger without damage to nail, initial encounter: Secondary | ICD-10-CM

## 2019-11-30 DIAGNOSIS — K219 Gastro-esophageal reflux disease without esophagitis: Secondary | ICD-10-CM | POA: Diagnosis not present

## 2019-11-30 DIAGNOSIS — Y998 Other external cause status: Secondary | ICD-10-CM | POA: Insufficient documentation

## 2019-11-30 DIAGNOSIS — I10 Essential (primary) hypertension: Secondary | ICD-10-CM | POA: Insufficient documentation

## 2019-11-30 DIAGNOSIS — W260XXA Contact with knife, initial encounter: Secondary | ICD-10-CM | POA: Insufficient documentation

## 2019-11-30 DIAGNOSIS — S61211A Laceration without foreign body of left index finger without damage to nail, initial encounter: Secondary | ICD-10-CM | POA: Insufficient documentation

## 2019-11-30 DIAGNOSIS — Y9389 Activity, other specified: Secondary | ICD-10-CM | POA: Diagnosis not present

## 2019-11-30 DIAGNOSIS — Y92009 Unspecified place in unspecified non-institutional (private) residence as the place of occurrence of the external cause: Secondary | ICD-10-CM | POA: Diagnosis not present

## 2019-11-30 MED ORDER — LIDOCAINE-EPINEPHRINE-TETRACAINE (LET) TOPICAL GEL
3.0000 mL | Freq: Once | TOPICAL | Status: AC
  Administered 2019-11-30: 3 mL via TOPICAL
  Filled 2019-11-30: qty 3

## 2019-11-30 NOTE — ED Provider Notes (Signed)
[facility]-EMERGENCY DEPT Provider Note   CSN: [id number] Arrival date & time: 11/30/19  1906     History Chief Complaint  Patient presents with  . Finger laceration    Lori Newton is a 64 y.o. female presenting for evaluation of finger injury.  Patient states prior to arrival she was cutting cabbage when the knife slipped, and she cut the distal aspect of her left index finger.  She reports acute onset bleeding, no significant pain.  No numbness.  No injury elsewhere.  She is not immunocompromise, no history of diabetes.  She does not know when her last tetanus shot was, thinks it has been more than 10 years.  She is not on blood thinners.  She has not taken anything for pain.  HPI     Past Medical History:  Diagnosis Date  . Asthma   . GERD (gastroesophageal reflux disease)   . Hypertension     Patient Active Problem List   Diagnosis Date Noted  . ANEMIA, IRON DEFICIENCY 12/30/2009  . BLOOD IN STOOL 12/30/2009    Past Surgical History:  Procedure Laterality Date  . CESAREAN SECTION       OB History   No obstetric history on file.     Family History  Problem Relation Age of Onset  . Arrhythmia Mother   . Cancer Father     Social History   Tobacco Use  . Smoking status: Never Smoker  . Smokeless tobacco: Never Used  Substance Use Topics  . Alcohol use: Yes  . Drug use: No    Home Medications Prior to Admission medications   Medication Sig Start Date End Date Taking? Authorizing Provider  albuterol (PROVENTIL HFA;VENTOLIN HFA) 108 (90 BASE) MCG/ACT inhaler Inhale 2 puffs into the lungs every 6 (six) hours as needed. wheezing    [provider]  amLODipine (NORVASC) 10 MG tablet Take 1 tablet (10 mg total) by mouth daily. 08/01/19 10/30/19  O'NealRonnald Ramp, MD  calcium carbonate (OS-CAL) 1250 MG chewable tablet Chew 1 tablet by mouth daily.    [provider]  cholecalciferol (VITAMIN D) 1000 UNITS  tablet Take 1,000 Units by mouth daily.    [provider]  Coenzyme Q10 (CO Q 10) 100 MG CAPS Take 100 mg by mouth daily.    [provider]  diltiazem (CARDIZEM) 30 MG tablet Take 2 tablets for 3 days, then 1 tablet for 3 days, then stop 08/01/19   O'Neal, Ronnald Ramp, MD  Flaxseed, Linseed, (FLAXSEED OIL PO) Take 1,000 mg by mouth daily.    [provider]  Boris Lown Oil 500 MG CAPS Take 500 mg by mouth daily.    [provider]  Omega-3 Fatty Acids (FISH OIL) 500 MG CAPS Take 500 mg by mouth daily.    [provider]  Turmeric (QC TUMERIC COMPLEX PO) Take 1 tablet by mouth daily.    [provider]    Allergies    Betadine [povidone iodine]  Review of Systems   Review of Systems  Skin: Positive for wound.  Neurological: Negative for numbness.    Physical Exam Updated Vital Signs BP (!) 153/96 (BP Location: Right Arm)   Pulse 88   Temp 98 F (36.7 C) (Oral)   Resp 18   SpO2 96%   Physical Exam Vitals and nursing note reviewed.  Constitutional:      General: She is not in acute distress.    Appearance: She is well-developed.  HENT:  Head: Normocephalic and atraumatic.  Pulmonary:     Effort: Pulmonary effort is normal.  Abdominal:     General: There is no distension.  Musculoskeletal:        General: Normal range of motion.       Hands:     Cervical back: Normal range of motion.     Comments: Small distal finger avulsion of the ulnar aspect of the distal left index finger.  Minimal continuous ooze.  Full active range of motion of the index finger against resistance and intact.  Good distal sensation and cap refill.  There is nail involvement, however no obvious bony involvement.  Skin:    General: Skin is warm.     Findings: No rash.  Neurological:     Mental Status: She is alert and oriented to person, place, and time.     ED Results / Procedures / Treatments   Labs (all labs ordered are listed, but only  abnormal results are displayed) Labs Reviewed - No data to display  EKG None  Radiology No results found.  Procedures Procedures (including critical care time)  Medications Ordered in ED Medications  lidocaine-EPINEPHrine-tetracaine (LET) topical gel (has no administration in time range)    ED Course  I have reviewed the triage vital signs and the nursing notes.  Pertinent labs & imaging results that were available during my care of the patient were reviewed by me and considered in my medical decision making (see chart for details).    MDM Rules/Calculators/A&P                          Pt presenting for evaluation of left finger injury.  On exam, patient is neurovascular intact.  She has an avulsion injury of the distal finger.  There is minimal bleeding.  Will apply let and quick clot as well as pressure dressing.  Discussed wound care instructions.  Discussed follow-up with PCP as needed.  Patient declines a tetanus shot today, states she will follow up with her PCP for Tdap update as needed.  At this time, patient appears safe for discharge.  Return precautions given.  Patient states she understands and agrees to plan.  Final Clinical Impression(s) / ED Diagnoses Final diagnoses:  Avulsion of fingertip, initial encounter    Rx / DC Orders ED Discharge Orders    None       Alveria Apley, PA-C 11/30/19 2025    Melene Plan, DO 11/30/19 2027

## 2019-11-30 NOTE — ED Triage Notes (Signed)
Patient arrives complaining of a finger laceration. Patient was cutting cabbage when she cut off the very end of her finger. Pressure applied. No other complaints.

## 2019-11-30 NOTE — Discharge Instructions (Signed)
Keep the dressing on for the next 24 hours.  After this, remove dressing, wash with soap and water.  Reapply a new dressing as needed. If the dressing is sticking, get it wet to prevent pulling and reopening the wound. Follow-up with your primary care doctor as needed for recheck of the wound, and for updated your tetanus shot. Use Tylenol or ibuprofen as needed for pain. Return to the emergency room with any new, worsening, concerning symptoms.

## 2019-12-30 ENCOUNTER — Encounter (HOSPITAL_BASED_OUTPATIENT_CLINIC_OR_DEPARTMENT_OTHER): Payer: BC Managed Care – PPO | Admitting: Cardiovascular Disease

## 2020-02-11 ENCOUNTER — Telehealth: Payer: Self-pay | Admitting: Cardiovascular Disease

## 2020-02-11 NOTE — Telephone Encounter (Signed)
lvm for patient to return call to get follow up scheduled with O'Neal from recall lists

## 2020-02-13 ENCOUNTER — Telehealth: Payer: Self-pay | Admitting: *Deleted

## 2020-02-13 NOTE — Telephone Encounter (Signed)
A message was left, re: her follow up visit. 

## 2020-02-17 ENCOUNTER — Telehealth: Payer: Self-pay

## 2020-02-17 NOTE — Telephone Encounter (Signed)
02-17-2020.  Patient states she is feeling fine and doesn't want to come back in for her follow up, it cost too much and she will call us back at some other time if she needs to.../ VB

## 2020-09-01 ENCOUNTER — Ambulatory Visit: Payer: Self-pay | Admitting: Allergy and Immunology

## 2020-09-18 LAB — LIPID PANEL
Cholesterol: 186 (ref 0–200)
HDL: 58 (ref 35–70)
LDL Cholesterol: 107
LDl/HDL Ratio: 3.2
Triglycerides: 115 (ref 40–160)

## 2020-09-18 LAB — HEPATIC FUNCTION PANEL
ALT: 18 (ref 7–35)
AST: 18 (ref 13–35)
Alkaline Phosphatase: 65 (ref 25–125)
Bilirubin, Total: 0.5

## 2020-09-18 LAB — BASIC METABOLIC PANEL
BUN: 11 (ref 4–21)
CO2: 31 — AB (ref 13–22)
Chloride: 103 (ref 99–108)
Creatinine: 0.8 (ref <=1.1)
Glucose: 83
Potassium: 4 (ref 3.4–5.3)
Sodium: 139 (ref 137–147)

## 2020-09-18 LAB — COMPREHENSIVE METABOLIC PANEL
Albumin: 4.2 (ref 3.5–5.0)
Calcium: 9.7 (ref 8.7–10.7)

## 2020-09-18 LAB — HEMOGLOBIN A1C: Hemoglobin A1C: 5.7

## 2020-09-24 LAB — FECAL OCCULT BLOOD, GUAIAC: Fecal Occult Blood: NEGATIVE

## 2020-10-06 ENCOUNTER — Ambulatory Visit: Payer: BC Managed Care – PPO | Admitting: Physician Assistant

## 2020-10-06 ENCOUNTER — Other Ambulatory Visit: Payer: Self-pay

## 2020-10-06 VITALS — BP 131/79 | HR 86 | Temp 98.7°F | Resp 18 | Ht 65.0 in | Wt 235.0 lb

## 2020-10-06 DIAGNOSIS — Z6839 Body mass index (BMI) 39.0-39.9, adult: Secondary | ICD-10-CM

## 2020-10-06 DIAGNOSIS — M79661 Pain in right lower leg: Secondary | ICD-10-CM

## 2020-10-06 DIAGNOSIS — M76891 Other specified enthesopathies of right lower limb, excluding foot: Secondary | ICD-10-CM

## 2020-10-06 NOTE — Progress Notes (Signed)
New Patient Office Visit  Subjective:  Patient ID: Lori Newton, female    DOB: May 12, 1955  Age: 65 y.o. MRN: [id number]  CC:  Chief Complaint  Patient presents with  . right leg pain    HPI Chevelle Coulson reports that she has been having pain in her right calf and behind her right knee for the past 2 weeks.  Denies swelling, tender to touch or hot to touch.  Denies injury or trauma.  Reports flexing her foot up and down does offer some relief.  Does endorse that she has been wearing a jelly type shoe without arch support.   Past Medical History:  Diagnosis Date  . Asthma   . GERD (gastroesophageal reflux disease)   . Hypertension     Past Surgical History:  Procedure Laterality Date  . CESAREAN SECTION      Family History  Problem Relation Age of Onset  . Arrhythmia Mother   . Cancer Father     Social History   Socioeconomic History  . Marital status: Married    Spouse name: Not on file  . Number of children: 7  . Years of education: Not on file  . Highest education level: Not on file  Occupational History  . Not on file  Tobacco Use  . Smoking status: Never Smoker  . Smokeless tobacco: Never Used  Substance and Sexual Activity  . Alcohol use: Yes  . Drug use: No  . Sexual activity: Not on file  Other Topics Concern  . Not on file  Social History Narrative  . Not on file   Social Determinants of Health   Financial Resource Strain: Not on file  Food Insecurity: Not on file  Transportation Needs: Not on file  Physical Activity: Not on file  Stress: Not on file  Social Connections: Not on file  Intimate Partner Violence: Not on file    ROS Review of Systems  Constitutional: Negative for chills and fever.  HENT: Negative.   Respiratory: Negative for shortness of breath.   Cardiovascular: Negative for chest pain.  Gastrointestinal: Negative.   Endocrine: Negative.   Genitourinary: Negative.   Musculoskeletal: Positive for arthralgias.  Negative for joint swelling.  Skin: Negative for color change.  Allergic/Immunologic: Negative.   Neurological: Negative for headaches.  Hematological: Negative.   Psychiatric/Behavioral: Negative.     Objective:   Today's Vitals: BP 131/79 (BP Location: Left Arm, Patient Position: Sitting, Cuff Size: Large)   Pulse 86   Temp 98.7 F (37.1 C) (Oral)   Resp 18   Ht 5\' 5"  (1.651 m)   Wt 235 lb (106.6 kg)   SpO2 98%   BMI 39.11 kg/m   Physical Exam Vitals and nursing note reviewed.  Constitutional:      Appearance: Normal appearance.  HENT:     Head: Normocephalic and atraumatic.     Right Ear: External ear normal.     Left Ear: External ear normal.     Nose: Nose normal.     Mouth/Throat:     Mouth: Mucous membranes are moist.     Pharynx: Oropharynx is clear.  Eyes:     Extraocular Movements: Extraocular movements intact.     Conjunctiva/sclera: Conjunctivae normal.     Pupils: Pupils are equal, round, and reactive to light.  Cardiovascular:     Rate and Rhythm: Normal rate and regular rhythm.     Pulses: Normal pulses.          Dorsalis pedis pulses  are 2+ on the right side and 2+ on the left side.       Posterior tibial pulses are 2+ on the right side and 2+ on the left side.     Heart sounds: Normal heart sounds.  Pulmonary:     Effort: Pulmonary effort is normal.     Breath sounds: Normal breath sounds.  Musculoskeletal:     Cervical back: Normal range of motion and neck supple.     Right lower leg: Normal. No tenderness. No edema.     Left lower leg: Normal. No tenderness. No edema.     Right ankle: Normal. No swelling. No tenderness. Normal range of motion.     Right Achilles Tendon: Normal.     Left ankle: Normal. No swelling. No tenderness. Normal range of motion.     Left Achilles Tendon: Normal.     Right foot: Normal range of motion. No swelling or tenderness.     Left foot: Normal range of motion. No swelling or tenderness.  Skin:    General: Skin  is warm and dry.  Neurological:     General: No focal deficit present.     Mental Status: She is alert and oriented to person, place, and time.  Psychiatric:        Mood and Affect: Mood normal.        Behavior: Behavior normal.        Thought Content: Thought content normal.        Judgment: Judgment normal.     Assessment & Plan:   Problem List Items Addressed This Visit      Other   Class 2 severe obesity due to excess calories with serious comorbidity and body mass index (BMI) of 39.0 to 39.9 in adult [facility])    Other Visit Diagnoses    Right calf pain    -  Primary      Outpatient Encounter Medications as of 10/06/2020  Medication Sig  . albuterol (PROVENTIL HFA;VENTOLIN HFA) 108 (90 BASE) MCG/ACT inhaler Inhale 2 puffs into the lungs every 6 (six) hours as needed. wheezing  . amLODipine (NORVASC) 10 MG tablet Take 1 tablet (10 mg total) by mouth daily.  . cholecalciferol (VITAMIN D) 1000 UNITS tablet Take 1,000 Units by mouth daily.  . Coenzyme Q10 (CO Q 10) 100 MG CAPS Take 100 mg by mouth daily.  Boris Lown Oil 500 MG CAPS Take 500 mg by mouth daily.  . Omega-3 Fatty Acids (FISH OIL) 500 MG CAPS Take 500 mg by mouth daily.  . Turmeric (QC TUMERIC COMPLEX PO) Take 1 tablet by mouth daily.  . [DISCONTINUED] diltiazem (CARDIZEM) 30 MG tablet Take 2 tablets for 3 days, then 1 tablet for 3 days, then stop  . calcium carbonate (OS-CAL) 1250 MG chewable tablet Chew 1 tablet by mouth daily. (Patient not taking: Reported on 10/06/2020)  . Flaxseed, Linseed, (FLAXSEED OIL PO) Take 1,000 mg by mouth daily. (Patient not taking: Reported on 10/06/2020)   No facility-administered encounter medications on file as of 10/06/2020.   1. Right calf pain Negative Homans' sign, relief with flex.  Patient education given on supportive care, strongly encouraged supportive shoes with good arch support.  Red flags given for prompt reevaluation.   I have reviewed the patient's medical history (PMH,  PSH, Social History, Family History, Medications, and allergies) , and have been updated if relevant. I spent 20 minutes reviewing chart and  face to face time with patient.  Follow-up: Return if symptoms worsen or fail to improve.   Kasandra Knudsen Mayers, PA-C

## 2020-10-06 NOTE — Progress Notes (Signed)
Patient presents with right leg pain beginning 2 weeks ago. Patient denies ever felling this pain prior. Patient denies swelling, hot to touch or tenderness. Patient reports flex to point helping with stretching. Patient has not taken medication today and patient has eaten today.

## 2020-10-06 NOTE — Patient Instructions (Signed)
I encourage you to use ibuprofen 200 mg - 800 mg every 8 hours as needed, this can also help with the inflammation.  Make sure that you are protecting your stomach while taking higher doses of ibuprofen.  I encourage you also to do gentle stretching as well as icing.  I strongly encourage proper fitting shoes with good arch support.    Please let us know if there is anything else we can do for you.  Roney Jaffe, PA-C Physician Assistant [facility] [email or web address]        Tendinitis  Tendinitis is inflammation of a tendon. A tendon is a strong cord of tissue that connects muscle to bone. Tendinitis can affect any tendon, but it most commonly affects the:  Shoulder tendon (rotator cuff).  Ankle tendon (Achilles tendon).  Elbow tendon (triceps tendon).  Tendons in the wrist. What are the causes? This condition may be caused by:  Overusing a tendon or muscle. This is common.  Age-related wear and tear.  Injury.  Inflammatory conditions, such as arthritis.  Certain medicines. What increases the risk? You are more likely to develop this condition if you do activities that involve the same movements over and over again (repetitive motions). What are the signs or symptoms? Symptoms of this condition may include:  Pain.  Tenderness.  Mild swelling.  Decreased range of motion. How is this diagnosed? This condition is diagnosed with a physical exam. You may also have tests, such as:  Ultrasound. This uses sound waves to make an image of the inside of your body in the affected area.  MRI. How is this treated? This condition may be treated by resting, icing, applying pressure (compression), and raising (elevating) the affected area above the level of your heart. This is known as RICE therapy. Treatment may also include:  Medicines to help reduce inflammation or to help reduce pain.  Exercises or physical  therapy to strengthen and stretch the tendon.  A brace or splint.  Surgery. This is rarely needed. Follow these instructions at home: If you have a splint or brace:  Wear the splint or brace as told by your health care provider. Remove it only as told by your health care provider.  Loosen the splint or brace if your fingers or toes tingle, become numb, or turn cold and blue.  Keep the splint or brace clean.  If the splint or brace is not waterproof: ? Do not let it get wet. ? Cover it with a watertight covering when you take a bath or shower. Managing pain, stiffness, and swelling  If directed, put ice on the affected area. ? If you have a removable splint or brace, remove it as told by your health care provider. ? Put ice in a plastic bag. ? Place a towel between your skin and the bag. ? Leave the ice on for 20 minutes, 2-3 times a day.  Move the fingers or toes of the affected limb often, if this applies. This can help to prevent stiffness and lessen swelling.  If directed, raise (elevate) the affected area above the level of your heart while you are sitting or lying down.   If directed, apply heat to the affected area before you exercise. Use the heat source that your health care provider recommends, such as a moist heat pack or a heating pad. ? Place a towel between your skin and the heat source. ? Leave the heat on for 20-30 minutes. ? Remove the heat  if your skin turns bright red. This is especially important if you are unable to feel pain, heat, or cold. You may have a greater risk of getting burned.      Driving  Do not drive or use heavy machinery while taking prescription pain medicine.  Ask your health care provider when it is safe to drive if you have a splint or brace on any part of your arm or leg. Activity  Rest the affected area as told by your health care provider.  Return to your normal activities as told by your health care provider. Ask your health  care provider what activities are safe for you.  Avoid using the affected area while you are experiencing symptoms of tendinitis.  Do exercises as told by your health care provider. General instructions  If you have a splint, do not put pressure on any part of the splint until it is fully hardened. This may take several hours.  Wear an elastic bandage or compression wrap only as told by your health care provider.  Take over-the-counter and prescription medicines only as told by your health care provider.  Keep all follow-up visits as told by your health care provider. This is important. Contact a health care provider if:  Your symptoms do not improve.  You develop new, unexplained problems, such as numbness in your hands. Summary  Tendinitis is inflammation of a tendon.  You are more likely to develop this condition if you do activities that involve the same movements over and over again.  This condition may be treated by resting, icing, applying pressure (compression), and elevating the area above the level of your heart. This is known as RICE therapy.  Avoid using the affected area while you are experiencing symptoms of tendinitis. This information is not intended to replace advice given to you by your health care provider. Make sure you discuss any questions you have with your health care provider. Document Revised: 10/31/2017 Document Reviewed: 09/13/2017 Elsevier Patient Education  2021 [employer].

## 2020-10-08 DIAGNOSIS — E66812 Obesity, class 2: Secondary | ICD-10-CM | POA: Insufficient documentation

## 2020-10-20 ENCOUNTER — Other Ambulatory Visit: Payer: Self-pay | Admitting: Cardiovascular Disease

## 2020-10-28 ENCOUNTER — Other Ambulatory Visit: Payer: Self-pay

## 2020-10-28 ENCOUNTER — Telehealth: Payer: Self-pay | Admitting: *Deleted

## 2020-10-28 ENCOUNTER — Encounter: Payer: Self-pay | Admitting: Family Medicine

## 2020-10-28 ENCOUNTER — Ambulatory Visit: Payer: BC Managed Care – PPO | Admitting: Family Medicine

## 2020-10-28 VITALS — BP 138/84 | HR 77 | Temp 97.5°F | Ht 65.0 in | Wt 232.4 lb

## 2020-10-28 DIAGNOSIS — K219 Gastro-esophageal reflux disease without esophagitis: Secondary | ICD-10-CM | POA: Diagnosis not present

## 2020-10-28 DIAGNOSIS — J4520 Mild intermittent asthma, uncomplicated: Secondary | ICD-10-CM | POA: Diagnosis not present

## 2020-10-28 DIAGNOSIS — J45909 Unspecified asthma, uncomplicated: Secondary | ICD-10-CM | POA: Insufficient documentation

## 2020-10-28 DIAGNOSIS — I10 Essential (primary) hypertension: Secondary | ICD-10-CM | POA: Diagnosis not present

## 2020-10-28 MED ORDER — ALBUTEROL SULFATE HFA 108 (90 BASE) MCG/ACT IN AERS: 2.0000 | INHALATION_SPRAY | Freq: Four times a day (QID) | RESPIRATORY_TRACT | 2 refills | Status: DC | PRN

## 2020-10-28 MED ORDER — FLUTICASONE PROPIONATE HFA 44 MCG/ACT IN AERO: 1.0000 | INHALATION_SPRAY | Freq: Two times a day (BID) | RESPIRATORY_TRACT | 2 refills | Status: DC

## 2020-10-28 NOTE — Telephone Encounter (Signed)
Lori Newton stated that Dr. Hyacinth Meeker sent to wrong pharmacy and needed the Rx's sent to [facility].  Escribed.

## 2020-10-28 NOTE — Progress Notes (Signed)
Provider:  Jacalyn Lefevre, MD  Careteam: Patient Care Team: Farris Has, MD as PCP - General (Family Medicine)  PLACE OF SERVICE:  [facility]  Advanced Directive information    Allergies  Allergen Reactions   Betadine [Povidone Iodine] Rash    No chief complaint on file.    HPI: Patient is a 65 y.o. female new patient here transferred from [place].  She is generally pretty healthy.  Her diagnoses include asthma GERD and hypertension.  She has no particular complaints today but has noticed some increasing pain in her knees especially the right side and some noises when she goes downstairs.  She has gained weight over the years and is unhappy with her weight now and has had some efforts toward losing weight but without success. Her asthma is intermittent at best.  She takes Flovent as a maintenance medicine with albuterol as a rescue inhaler.  She sees GYN for Pap smears and has done mammograms but is probably overdue.  Does not seem to be interested in colonoscopy.  Review of Systems:  Review of Systems  Respiratory: Negative.    Cardiovascular: Negative.   Gastrointestinal: Negative.   Musculoskeletal:  Positive for joint pain.       In these  Neurological: Negative.   Psychiatric/Behavioral: Negative.    All other systems reviewed and are negative.  Past Medical History:  Diagnosis Date   Asthma    GERD (gastroesophageal reflux disease)    Hypertension    Past Surgical History:  Procedure Laterality Date   CESAREAN SECTION     Social History:   reports that she has never smoked. She has never used smokeless tobacco. She reports current alcohol use. She reports that she does not use drugs.  Family History  Problem Relation Age of Onset   Arrhythmia Mother    Bone cancer Father    Heart Problems Daughter    Chromosomal disorder Daughter     Medications: Patient's Medications  New Prescriptions   No medications on file  Previous Medications   ALBUTEROL  (PROVENTIL HFA;VENTOLIN HFA) 108 (90 BASE) MCG/ACT INHALER    Inhale 2 puffs into the lungs every 6 (six) hours as needed. wheezing   AMLODIPINE (NORVASC) 10 MG TABLET    Take 1 tablet by mouth once daily   CALCIUM CARBONATE (OS-CAL) 1250 MG CHEWABLE TABLET    Chew 1 tablet by mouth daily.   CHOLECALCIFEROL (VITAMIN D) 1000 UNITS TABLET    Take 1,000 Units by mouth daily.   COENZYME Q10 (CO Q 10) 100 MG CAPS    Take 100 mg by mouth daily.   FLAXSEED, LINSEED, (FLAXSEED OIL PO)    Take 1,000 mg by mouth daily.   KRILL OIL 500 MG CAPS    Take 500 mg by mouth daily.   OMEGA-3 FATTY ACIDS (FISH OIL) 500 MG CAPS    Take 500 mg by mouth daily.   TURMERIC (QC TUMERIC COMPLEX PO)    Take 1 tablet by mouth daily.  Modified Medications   No medications on file  Discontinued Medications   No medications on file    Physical Exam:  There were no vitals filed for this visit. There is no height or weight on file to calculate BMI. Wt Readings from Last 3 Encounters:  10/06/20 235 lb (106.6 kg)  08/01/19 225 lb (102.1 kg)    Physical Exam Vitals and nursing note reviewed.  Constitutional:      Appearance: Normal appearance. She is  obese.  HENT:     Head: Normocephalic.     Right Ear: Tympanic membrane normal.     Left Ear: Tympanic membrane normal.  Cardiovascular:     Rate and Rhythm: Normal rate and regular rhythm.  Pulmonary:     Effort: Pulmonary effort is normal.     Breath sounds: Normal breath sounds.  Abdominal:     General: Abdomen is flat. Bowel sounds are normal.     Palpations: Abdomen is soft.  Musculoskeletal:        General: Normal range of motion.     Cervical back: Normal range of motion.     Comments: There is mild crepitance with flexion extension of the right knee  Skin:    General: Skin is warm and dry.  Neurological:     General: No focal deficit present.     Mental Status: She is alert and oriented to person, place, and time.  Psychiatric:        Mood and  Affect: Mood normal.        Behavior: Behavior normal.        Thought Content: Thought content normal.        Judgment: Judgment normal.    Labs reviewed: Basic Metabolic Panel: No results for input(s): NA, K, CL, CO2, GLUCOSE, BUN, CREATININE, CALCIUM, MG, PHOS, TSH in the last 8760 hours. Liver Function Tests: No results for input(s): AST, ALT, ALKPHOS, BILITOT, PROT, ALBUMIN in the last 8760 hours. No results for input(s): LIPASE, AMYLASE in the last 8760 hours. No results for input(s): AMMONIA in the last 8760 hours. CBC: No results for input(s): WBC, NEUTROABS, HGB, HCT, MCV, PLT in the last 8760 hours. Lipid Panel: No results for input(s): CHOL, HDL, LDLCALC, TRIG, CHOLHDL, LDLDIRECT in the last 8760 hours. TSH: No results for input(s): TSH in the last 8760 hours. A1C: Lab Results  Component Value Date   HGBA1C 5.7 05/19/2008     Assessment/Plan   1. Mild intermittent asthma without complication Continue with current inhalers.  She did receive a course of prednisone for an exacerbation recently  2. Hypertension, unspecified type Blood pressure well controlled with amlodipine no side effects such as edema  Jacalyn Lefevre, MD [facility] [phone]

## 2020-10-28 NOTE — Telephone Encounter (Signed)
Received fax from [facility] requesting the Albuterol to be changed to proAir instead.   Please Advise.

## 2020-10-28 NOTE — Telephone Encounter (Signed)
Is this ok to change.  Pended Rx and sent to Dr. Hyacinth Meeker for approval.

## 2020-10-28 NOTE — Patient Instructions (Signed)
Voltaren gel for knee pain [email or web address].[email or web address]">  DASH Eating Plan DASH stands for Dietary Approaches to Stop Hypertension. The DASH eating plan is a healthy eating plan that has been shown to: Reduce high blood pressure (hypertension). Reduce your risk for type 2 diabetes, heart disease, and stroke. Help with weight loss. What are tips for following this plan? Reading food labels Check food labels for the amount of salt (sodium) per serving. Choose foods with less than 5 percent of the Daily Value of sodium. Generally, foods with less than 300 milligrams (mg) of sodium per serving fit into this eating plan. To find whole grains, look for the word "whole" as the first word in the ingredient list. Shopping Buy products labeled as "low-sodium" or "no salt added." Buy fresh foods. Avoid canned foods and pre-made or frozen meals. Cooking Avoid adding salt when cooking. Use salt-free seasonings or herbs instead of table salt or sea salt. Check with your health care provider or pharmacist before using salt substitutes. Do not fry foods. Cook foods using healthy methods such as baking, boiling, grilling, roasting, and broiling instead. Cook with heart-healthy oils, such as olive, canola, avocado, soybean, or sunflower oil. Meal planning  Eat a balanced diet that includes: 4 or more servings of fruits and 4 or more servings of vegetables each day. Try to fill one-half of your plate with fruits and vegetables. 6-8 servings of whole grains each day. Less than 6 oz (170 g) of lean meat, poultry, or fish each day. A 3-oz (85-g) serving of meat is about the same size as a deck of cards. One egg equals 1 oz (28 g). 2-3 servings of low-fat dairy each day. One serving is 1 cup (237 mL). 1 serving of nuts, seeds, or beans 5 times each week. 2-3 servings of heart-healthy fats. Healthy fats called omega-3 fatty acids are found in foods such as walnuts, flaxseeds,  fortified milks, and eggs. These fats are also found in cold-water fish, such as sardines, salmon, and mackerel. Limit how much you eat of: Canned or prepackaged foods. Food that is high in trans fat, such as some fried foods. Food that is high in saturated fat, such as fatty meat. Desserts and other sweets, sugary drinks, and other foods with added sugar. Full-fat dairy products. Do not salt foods before eating. Do not eat more than 4 egg yolks a week. Try to eat at least 2 vegetarian meals a week. Eat more home-cooked food and less restaurant, buffet, and fast food.  Lifestyle When eating at a restaurant, ask that your food be prepared with less salt or no salt, if possible. If you drink alcohol: Limit how much you use to: 0-1 drink a day for women who are not pregnant. 0-2 drinks a day for men. Be aware of how much alcohol is in your drink. In the [place] one drink equals one 12 oz bottle of beer (355 mL), one 5 oz glass of wine (148 mL), or one 1 oz glass of hard liquor (44 mL). General information Avoid eating more than 2,300 mg of salt a day. If you have hypertension, you may need to reduce your sodium intake to 1,500 mg a day. Work with your health care provider to maintain a healthy body weight or to lose weight. Ask what an ideal weight is for you. Get at least 30 minutes of exercise that causes your heart to beat faster (aerobic exercise) most days of the week. Activities may include walking, swimming, or biking. Work  with your health care provider or dietitian to adjust your eating plan to your individual calorie needs. What foods should I eat? Fruits All fresh, dried, or frozen fruit. Canned fruit in natural juice (without addedsugar). Vegetables Fresh or frozen vegetables (raw, steamed, roasted, or grilled). Low-sodium or reduced-sodium tomato and vegetable juice. Low-sodium or reduced-sodium tomatosauce and tomato paste. Low-sodium or reduced-sodium canned  vegetables. Grains Whole-grain or whole-wheat bread. Whole-grain or whole-wheat pasta. Brown rice. Orpah Cobb. Bulgur. Whole-grain and low-sodium cereals. Pita bread.Low-fat, low-sodium crackers. Whole-wheat flour tortillas. Meats and other proteins Skinless chicken or [place]. Ground chicken or [place]. Pork with fat trimmed off. Fish and seafood. Egg whites. Dried beans, peas, or lentils. Unsalted nuts, nut butters, and seeds. Unsalted canned beans. Lean cuts of beef with fat trimmed off. Low-sodium, lean precooked or cured meat, such as sausages or meatloaves. Dairy Low-fat (1%) or fat-free (skim) milk. Reduced-fat, low-fat, or fat-free cheeses. Nonfat, low-sodium ricotta or cottage cheese. Low-fat or nonfatyogurt. Low-fat, low-sodium cheese. Fats and oils Soft margarine without trans fats. Vegetable oil. Reduced-fat, low-fat, or light mayonnaise and salad dressings (reduced-sodium). Canola, safflower, olive, avocado, soybean, andsunflower oils. Avocado. Seasonings and condiments Herbs. Spices. Seasoning mixes without salt. Other foods Unsalted popcorn and pretzels. Fat-free sweets. The items listed above may not be a complete list of foods and beverages you can eat. Contact a dietitian for more information. What foods should I avoid? Fruits Canned fruit in a light or heavy syrup. Fried fruit. Fruit in cream or buttersauce. Vegetables Creamed or fried vegetables. Vegetables in a cheese sauce. Regular canned vegetables (not low-sodium or reduced-sodium). Regular canned tomato sauce and paste (not low-sodium or reduced-sodium). Regular tomato and vegetable juice(not low-sodium or reduced-sodium). Rosita Fire. Olives. Grains Baked goods made with fat, such as croissants, muffins, or some breads. Drypasta or rice meal packs. Meats and other proteins Fatty cuts of meat. Ribs. Fried meat. Tomasa Blase. Bologna, salami, and other precooked or cured meats, such as sausages or meat loaves. Fat from the back  of a pig (fatback). Bratwurst. Salted nuts and seeds. Canned beans with added salt. Canned orsmoked fish. Whole eggs or egg yolks. Chicken or [place] with skin. Dairy Whole or 2% milk, cream, and half-and-half. Whole or full-fat cream cheese. Whole-fat or sweetened yogurt. Full-fat cheese. Nondairy creamers. Whippedtoppings. Processed cheese and cheese spreads. Fats and oils Butter. Stick margarine. Lard. Shortening. Ghee. Bacon fat. Tropical oils, suchas coconut, palm kernel, or palm oil. Seasonings and condiments Onion salt, garlic salt, seasoned salt, table salt, and sea salt. Worcestershire sauce. Tartar sauce. Barbecue sauce. Teriyaki sauce. Soy sauce, including reduced-sodium. Steak sauce. Canned and packaged gravies. Fish sauce. Oyster sauce. Cocktail sauce. Store-bought horseradish. Ketchup. Mustard. Meat flavorings and tenderizers. Bouillon cubes. Hot sauces. Pre-made or packaged marinades. Pre-made or packaged taco seasonings. Relishes. Regular saladdressings. Other foods Salted popcorn and pretzels. The items listed above may not be a complete list of foods and beverages you should avoid. Contact a dietitian for more information. Where to find more information National Heart, Lung, and Blood Institute: PopSteam.is American Heart Association: www.heart.org Academy of Nutrition and Dietetics: www.eatright.org National Kidney Foundation: www.kidney.org Summary The DASH eating plan is a healthy eating plan that has been shown to reduce high blood pressure (hypertension). It may also reduce your risk for type 2 diabetes, heart disease, and stroke. When on the DASH eating plan, aim to eat more fresh fruits and vegetables, whole grains, lean proteins, low-fat dairy, and heart-healthy fats. With the DASH eating plan, you should limit  salt (sodium) intake to 2,300 mg a day. If you have hypertension, you may need to reduce your sodium intake to 1,500 mg a day. Work with your health care  provider or dietitian to adjust your eating plan to your individual calorie needs. This information is not intended to replace advice given to you by your health care provider. Make sure you discuss any questions you have with your healthcare provider. Document Revised: 03/29/2019 Document Reviewed: 03/29/2019 Elsevier Patient Education  2022 [employer].

## 2020-11-27 ENCOUNTER — Encounter: Payer: Self-pay | Admitting: Adult Health

## 2020-11-27 ENCOUNTER — Ambulatory Visit: Payer: BC Managed Care – PPO | Admitting: Adult Health

## 2020-11-27 ENCOUNTER — Other Ambulatory Visit: Payer: Self-pay

## 2020-11-27 VITALS — BP 136/86 | HR 82 | Temp 97.3°F | Ht 65.0 in | Wt 233.2 lb

## 2020-11-27 DIAGNOSIS — J4520 Mild intermittent asthma, uncomplicated: Secondary | ICD-10-CM | POA: Diagnosis not present

## 2020-11-27 DIAGNOSIS — R42 Dizziness and giddiness: Secondary | ICD-10-CM

## 2020-11-27 MED ORDER — MECLIZINE HCL 25 MG PO TABS: 25.0000 mg | ORAL_TABLET | Freq: Three times a day (TID) | ORAL | 0 refills | Status: AC | PRN

## 2020-11-27 MED ORDER — MONTELUKAST SODIUM 10 MG PO TABS
10.0000 mg | ORAL_TABLET | Freq: Every day | ORAL | 3 refills | Status: DC
Start: 2020-11-27 — End: 2021-03-09

## 2020-11-27 NOTE — Progress Notes (Signed)
Location:  [facility]   Place of Service:   clinic    CODE STATUS: full code   Allergies  Allergen Reactions  . Betadine [Povidone Iodine] Rash    Chief Complaint  Patient presents with  . Acute Visit    Patient presents today for lightheaded and dizziness since yesterday,11/26/20 morning. She reports possible vertigo as well but was never diagnosis with it.    HPI:  Yesterday she had vertigo all day long. When she turned her head made it worse; it would go away by itself. She denies any headaches; no blurring of vision. She does have a cough; which comes and goes. The cough does get worse at night. She denies any sore throat; no wheezing no loss of taste or smell. There have been no fevers.   Past Medical History:  Diagnosis Date  . Asthma   . GERD (gastroesophageal reflux disease)   . Hypertension     Past Surgical History:  Procedure Laterality Date  . CESAREAN SECTION      Social History   Socioeconomic History  . Marital status: Married    Spouse name: Not on file  . Number of children: 7  . Years of education: Not on file  . Highest education level: Not on file  Occupational History  . Not on file  Tobacco Use  . Smoking status: Never  . Smokeless tobacco: Never  Vaping Use  . Vaping Use: Never used  Substance and Sexual Activity  . Alcohol use: Yes  . Drug use: No  . Sexual activity: Not on file  Other Topics Concern  . Not on file  Social History Narrative   Diet: Keto, fast food and others      Caffeine: sometimes      Married, if yes what year: yes, 1978      Do you live in a house, apartment, assisted living, condo, trailer, ect: House      Is it one or more stories: 2      How many persons live in your home? 6      Pets: none      Highest level or education completed: 12th       Current/Past profession: bus driver      Exercise:  sometimes                Type and how often: hula hoop 3 times a week         Living Will: No   DNR: No    POA/HPOA: No      Functional Status:   Do you have difficulty bathing or dressing yourself? No   Do you have difficulty preparing food or eating? No   Do you have difficulty managing your medications? No   Do you have difficulty managing your finances? No   Do you have difficulty affording your medications? No   Social Determinants of Corporate investment banker Strain: Not on file  Food Insecurity: Not on file  Transportation Needs: Not on file  Physical Activity: Not on file  Stress: Not on file  Social Connections: Not on file  Intimate Partner Violence: Not on file   Family History  Problem Relation Age of Onset  . Single kidney Mother   . Bone cancer Father   . Heart Problems Daughter   . Chromosomal disorder Daughter       VITAL SIGNS BP 136/86 (BP Location: Left Arm, Patient Position: Sitting, Cuff Size: Normal)   Pulse  82   Temp (!) 97.3 F (36.3 C) (Temporal)   Ht 5\' 5"  (1.651 m)   Wt 233 lb 3.2 oz (105.8 kg)   SpO2 98%   BMI 38.81 kg/m   Outpatient Encounter Medications as of 11/27/2020  Medication Sig  . albuterol (VENTOLIN HFA) 108 (90 Base) MCG/ACT inhaler Inhale 2 puffs into the lungs every 6 (six) hours as needed. wheezing  . albuterol (VENTOLIN HFA) 108 (90 Base) MCG/ACT inhaler Inhale 2 puffs into the lungs every 6 (six) hours as needed for wheezing or shortness of breath.  11/29/2020 amLODipine (NORVASC) 10 MG tablet Take 1 tablet by mouth once daily  . cholecalciferol (VITAMIN D) 1000 UNITS tablet Take 1,000 Units by mouth daily.  . Coenzyme Q10 (CO Q 10) 100 MG CAPS Take 100 mg by mouth daily.  . fluticasone (FLOVENT HFA) 44 MCG/ACT inhaler Inhale 1 puff into the lungs 2 (two) times daily.  Marland Kitchen Oil 500 MG CAPS Take 500 mg by mouth daily.  . meclizine (ANTIVERT) 25 MG tablet Take 1 tablet (25 mg total) by mouth 3 (three) times daily as needed for up to 4 days for dizziness.  . montelukast (SINGULAIR) 10 MG tablet Take 1 tablet (10 mg total) by mouth  at bedtime.  . Omega-3 Fatty Acids (FISH OIL) 500 MG CAPS Take 500 mg by mouth daily.  . Turmeric (QC TUMERIC COMPLEX PO) Take 1 tablet by mouth daily.   No facility-administered encounter medications on file as of 11/27/2020.     SIGNIFICANT DIAGNOSTIC EXAMS  Review of Systems  Constitutional:  Negative for malaise/fatigue.  Respiratory:  Positive for cough. Negative for sputum production, shortness of breath and wheezing.   Cardiovascular:  Negative for chest pain, palpitations and leg swelling.  Gastrointestinal:  Negative for abdominal pain, constipation and heartburn.  Musculoskeletal:  Negative for back pain, joint pain and myalgias.  Skin: Negative.   Neurological:  Positive for dizziness. Negative for headaches.  Psychiatric/Behavioral:  The patient is not nervous/anxious.     Physical Exam Constitutional:      General: She is not in acute distress.    Appearance: She is well-developed. She is obese. She is not diaphoretic.  Neck:     Thyroid: No thyromegaly.  Cardiovascular:     Rate and Rhythm: Normal rate and regular rhythm.     Pulses: Normal pulses.     Heart sounds: Normal heart sounds.  Pulmonary:     Effort: Pulmonary effort is normal. No respiratory distress.     Breath sounds: Normal breath sounds.  Abdominal:     General: Bowel sounds are normal. There is no distension.     Palpations: Abdomen is soft.     Tenderness: There is no abdominal tenderness.  Musculoskeletal:        General: Normal range of motion.     Cervical back: Neck supple.     Right lower leg: No edema.     Left lower leg: No edema.  Lymphadenopathy:     Cervical: No cervical adenopathy.  Skin:    General: Skin is warm and dry.  Neurological:     General: No focal deficit present.     Mental Status: She is alert and oriented to person, place, and time.     Cranial Nerves: No cranial nerve deficit.     Motor: No weakness.     Gait: Gait normal.  Psychiatric:        Mood and  Affect: Mood normal.  ASSESSMENT/ PLAN:  TODAY   Mild intermittent uncomplicated asthma Vertigo  Will begin singulair 10 mg nightly will follow up with pulmonology in Sept.  Will begin meclizine 25 mg every 8 hours as needed through 12-01-20.    Synthia Innocent NP [facility]  Contact [phone] Monday through Friday 8am- 5pm  After hours call [phone]

## 2020-11-27 NOTE — Patient Instructions (Addendum)
For your asthma: will begin singulair 10 mg nightly  For your vertigo: will begin antivert (meclizine) 25 mg every 8 hours as needed through 12-01-20.

## 2020-12-18 ENCOUNTER — Ambulatory Visit: Payer: Self-pay | Admitting: Allergy

## 2020-12-29 ENCOUNTER — Ambulatory Visit: Payer: Self-pay | Admitting: Allergy and Immunology

## 2021-01-19 ENCOUNTER — Ambulatory Visit: Payer: BC Managed Care – PPO | Admitting: Allergy & Immunology

## 2021-01-26 ENCOUNTER — Ambulatory Visit: Payer: BC Managed Care – PPO | Admitting: Allergy and Immunology

## 2021-01-26 ENCOUNTER — Other Ambulatory Visit: Payer: Self-pay

## 2021-01-26 VITALS — BP 130/84 | HR 71 | Temp 97.6°F | Resp 16 | Ht 63.0 in | Wt 231.8 lb

## 2021-01-26 DIAGNOSIS — J4540 Moderate persistent asthma, uncomplicated: Secondary | ICD-10-CM | POA: Diagnosis not present

## 2021-01-26 MED ORDER — TRELEGY ELLIPTA 200-62.5-25 MCG/INH IN AEPB: 1.0000 | INHALATION_SPRAY | Freq: Every day | RESPIRATORY_TRACT | 5 refills | Status: DC

## 2021-01-26 NOTE — Progress Notes (Signed)
[place] - [place] - [place] - [place] - [place]   Follow-up Note  Referring Provider: Frederica Kuster, MD Primary Provider: Frederica Kuster, MD Date of Office Visit: 01/26/2021  Subjective:   Lori Newton (DOB: 1955-09-22) is a 65 y.o. female who [facility] in re-evaluation of the following:  HPI: Lori Newton returns to this clinic in evaluation of asthma.  It has been approximately 6 years since have seen her in this clinic.  Her greatest concern is the fact that her asthma has changed somewhat since June 2022.  At that point in time she did require a course of systemic steroids for issues with cough and wheezing and chest tightness.  She was started on Singulair and Flovent at that point in time and she did improve somewhat yet she still continues to have evening chest tightness and some cough and wheezing.  Initially her cough was quite significant and it sounds as though it is associated with micturition and some gagging.  She does have a history of reflux but this only bothers her when eating spicy food or chocolate and it only presents itself as epigastric pain when it does present.  She does not have any significant regurgitation and she does not have a lot of throat symptoms such as throat clearing and intermittent raspy voice.  She uses her bronchodilator about twice a week at this point in time.  Allergies as of 01/26/2021       Reactions   Betadine [povidone Iodine] Rash        Medication List    albuterol 108 (90 Base) MCG/ACT inhaler Commonly known as: VENTOLIN HFA Inhale 2 puffs into the lungs every 6 (six) hours as needed for wheezing or shortness of breath.   albuterol 108 (90 Base) MCG/ACT inhaler Commonly known as: VENTOLIN HFA Inhale 2 puffs into the lungs every 6 (six) hours as needed. wheezing   amLODipine 10 MG tablet Commonly known as: NORVASC Take 1 tablet by mouth once daily    cholecalciferol 1000 units tablet Commonly known as: VITAMIN D Take 1,000 Units by mouth daily.   Co Q 10 100 MG Caps Take 100 mg by mouth daily.   Fish Oil 500 MG Caps Take 500 mg by mouth daily.   fluticasone 44 MCG/ACT inhaler Commonly known as: FLOVENT HFA Inhale 1 puff into the lungs 2 (two) times daily.   Krill Oil 500 MG Caps Take 500 mg by mouth daily.   montelukast 10 MG tablet Commonly known as: SINGULAIR Take 1 tablet (10 mg total) by mouth at bedtime.   QC TUMERIC COMPLEX PO Take 1 tablet by mouth daily.    Past Medical History:  Diagnosis Date   Asthma    GERD (gastroesophageal reflux disease)    Hypertension     Past Surgical History:  Procedure Laterality Date   CESAREAN SECTION      Review of systems negative except as noted in HPI / PMHx or noted below:  Review of Systems  Constitutional: Negative.   HENT: Negative.    Eyes: Negative.   Respiratory: Negative.    Cardiovascular: Negative.   Gastrointestinal: Negative.   Genitourinary: Negative.   Musculoskeletal: Negative.   Skin: Negative.   Neurological: Negative.   Endo/Heme/Allergies: Negative.   Psychiatric/Behavioral: Negative.      Objective:   Vitals:   01/26/21 1404  BP: 130/84  Pulse: 71  Resp: 16  Temp: 97.6 F (36.4 C)  SpO2: 95%   Height: 5\' 3"  (160 cm)  Weight: 231 lb 12.8 oz (105.1 kg)   Physical Exam Constitutional:      Appearance: She is not diaphoretic.  HENT:     Head: Normocephalic.     Right Ear: Tympanic membrane, ear canal and external ear normal.     Left Ear: Tympanic membrane, ear canal and external ear normal.     Nose: Nose normal. No mucosal edema or rhinorrhea.     Mouth/Throat:     Pharynx: Uvula midline. No oropharyngeal exudate.  Eyes:     Conjunctiva/sclera: Conjunctivae normal.  Neck:     Thyroid: No thyromegaly.     Trachea: Trachea normal. No tracheal tenderness or tracheal deviation.  Cardiovascular:     Rate and Rhythm: Normal  rate and regular rhythm.     Heart sounds: Normal heart sounds, S1 normal and S2 normal. No murmur heard. Pulmonary:     Effort: No respiratory distress.     Breath sounds: Normal breath sounds. No stridor. No wheezing or rales.  Lymphadenopathy:     Head:     Right side of head: No tonsillar adenopathy.     Left side of head: No tonsillar adenopathy.     Cervical: No cervical adenopathy.  Skin:    Findings: No erythema or rash.     Nails: There is no clubbing.  Neurological:     Mental Status: She is alert.    Diagnostics:    Spirometry was performed and demonstrated an FEV1 of 1.66 at 74 % of predicted.  FEV1/FVC = 0.65  Assessment and Plan:   1. Not well controlled moderate persistent asthma    1.  Treat and prevent inflammation:  A. Montelukast 10 mg - 1 tablet 1 time per day B. Trelegy 200 - 1 inhalation 1 time per day  2. If needed:  A. Albuterol HFA - 2 inhalations every 4-6 hours  3. Return to clinic in 4 weeks or sooner if needed  4. Obtain fall flu vaccine  I am going to start [place] on a triple inhaler in addition to continuing her on a leukotrienes modifier and we will see what type of effect we get over the course of the next 4 weeks utilizing this plan of anti-inflammatory medication for her respiratory tract.  I will see her back in this clinic at that point in time or earlier if there is a problem.  [place], MD Allergy / Immunology [facility]

## 2021-01-26 NOTE — Patient Instructions (Addendum)
  1.  Treat and prevent inflammation:  A. Montelukast 10 mg - 1 tablet 1 time per day B. Trelegy 200 - 1 inhalation 1 time per day  2. If needed:  A. Albuterol HFA - 2 inhalations every 4-6 hours  3. Return to clinic in 4 weeks or sooner if needed  4. Obtain fall flu I am going to start [place] on a triple inhaler vaccine

## 2021-01-26 NOTE — Progress Notes (Deleted)
[place]   Dear Dr. Hyacinth Meeker,  Thank you for referring Amenah Tucci to the [facility] [place] on 01/26/2021.   Below is a summation of this patient's evaluation and recommendations.  Thank you for your referral. I will keep you informed about this patient's response to treatment.   If you have any questions please do not hesitate to contact me.   Sincerely,  Jessica Priest, MD Allergy / Immunology [facility]   ______________________________________________________________________    NEW PATIENT NOTE  Referring Provider: Frederica Kuster, MD Primary Provider: Frederica Kuster, MD Date of office visit: 01/26/2021    Subjective:   Chief Complaint:  Lori Newton (DOB: February 05, 1956) is a 65 y.o. female who presents to the clinic on 01/26/2021 with a chief complaint of No chief complaint on file. Marland Kitchen     HPI:   Past Medical History:  Diagnosis Date   Asthma    GERD (gastroesophageal reflux disease)    Hypertension     Past Surgical History:  Procedure Laterality Date   CESAREAN SECTION      Allergies as of 01/26/2021       Reactions   Betadine [povidone Iodine] Rash        Medication List        Accurate as of January 26, 2021  2:01 PM. If you have any questions, ask your nurse or doctor.          albuterol 108 (90 Base) MCG/ACT inhaler Commonly known as: VENTOLIN HFA Inhale 2 puffs into the lungs every 6 (six) hours as needed for wheezing or shortness of breath.   albuterol 108 (90 Base) MCG/ACT inhaler Commonly known as: VENTOLIN HFA Inhale 2 puffs into the lungs every 6 (six) hours as needed. wheezing   amLODipine 10 MG tablet Commonly known as: NORVASC Take 1 tablet by mouth once daily   cholecalciferol 1000 units tablet Commonly known as: VITAMIN D Take 1,000 Units by mouth daily.   Co Q 10 100  MG Caps Take 100 mg by mouth daily.   Fish Oil 500 MG Caps Take 500 mg by mouth daily.   fluticasone 44 MCG/ACT inhaler Commonly known as: FLOVENT HFA Inhale 1 puff into the lungs 2 (two) times daily.   Krill Oil 500 MG Caps Take 500 mg by mouth daily.   montelukast 10 MG tablet Commonly known as: SINGULAIR Take 1 tablet (10 mg total) by mouth at bedtime.   QC TUMERIC COMPLEX PO Take 1 tablet by mouth daily.        Review of systems negative except as noted in HPI / PMHx or noted below:  ROS  Family History  Problem Relation Age of Onset   Single kidney Mother    Bone cancer Father    Heart Problems Daughter    Chromosomal disorder Daughter     Social History   Socioeconomic History   Marital status: Married    Spouse name: Not on file   Number of children: 7   Years of education: Not on file   Highest education level: Not on file  Occupational History   Not on file  Tobacco Use   Smoking status: Never   Smokeless tobacco: Never  Vaping Use   Vaping Use: Never used  Substance and Sexual Activity   Alcohol use: Yes   Drug use: No   Sexual  activity: Not on file  Other Topics Concern   Not on file  Social History Narrative   Diet: Keto, fast food and others      Caffeine: sometimes      Married, if yes what year: yes, 1978      Do you live in a house, apartment, assisted living, condo, trailer, ect: House      Is it one or more stories: 2      How many persons live in your home? 6      Pets: none      Highest level or education completed: 12th       Current/Past profession: bus driver      Exercise:  sometimes                Type and how often: hula hoop 3 times a week         Living Will: No   DNR: No   POA/HPOA: No      Functional Status:   Do you have difficulty bathing or dressing yourself? No   Do you have difficulty preparing food or eating? No   Do you have difficulty managing your medications? No   Do you have difficulty  managing your finances? No   Do you have difficulty affording your medications? No   Social Determinants of Corporate investment banker Strain: Not on file  Food Insecurity: Not on file  Transportation Needs: Not on file  Physical Activity: Not on file  Stress: Not on file  Social Connections: Not on file  Intimate Partner Violence: Not on file    Environmental and Social history   Objective:  There were no vitals filed for this visit.      Physical Exam  Diagnostics: Allergy skin tests were performed.   Spirometry was performed and demonstrated an FEV1 of *** @ *** % of predicted. FEV1/FVC = ***  The patient had an Asthma Control Test with the following results:  .     Assessment and Plan:    No diagnosis found.  Patient Instructions   1.   2.   3.   4.   5.   6.   7.   8.   Jessica Priest, MD Allergy / Immunology [facility] [place]

## 2021-01-27 ENCOUNTER — Other Ambulatory Visit: Payer: Self-pay | Admitting: *Deleted

## 2021-01-27 ENCOUNTER — Telehealth: Payer: Self-pay | Admitting: Allergy and Immunology

## 2021-01-27 MED ORDER — TRELEGY ELLIPTA 200-62.5-25 MCG/INH IN AEPB: 1.0000 | INHALATION_SPRAY | Freq: Every day | RESPIRATORY_TRACT | 5 refills | Status: DC

## 2021-01-27 NOTE — Telephone Encounter (Signed)
Refilled the prescription and attached a coupon for the Trelegy to hopefully help reduce the cost of the medication. Called and left the patient a detailed voicemail per DPR permission advising of the coupon and if the medication is too expensive to please call us back.

## 2021-01-27 NOTE — Telephone Encounter (Signed)
Pt states Trelogy was prescribed but she will not be able to afford it, and needs an alternative.

## 2021-02-01 ENCOUNTER — Encounter: Payer: Self-pay | Admitting: Allergy and Immunology

## 2021-02-17 ENCOUNTER — Telehealth: Payer: Self-pay | Admitting: Allergy and Immunology

## 2021-02-17 MED ORDER — BUDESONIDE-FORMOTEROL FUMARATE 160-4.5 MCG/ACT IN AERO
2.0000 | INHALATION_SPRAY | Freq: Two times a day (BID) | RESPIRATORY_TRACT | 3 refills | Status: DC
Start: 2021-02-17 — End: 2021-03-16

## 2021-02-17 NOTE — Telephone Encounter (Signed)
Please inform Lori Newton that her Trelegy can certainly cause some problems with fast heart rate and blurred vision.  There are other things that can cause this as well but we will assume that it is because of her Trelegy and change her to Symbicort 160 - 2 inhalations twice a day

## 2021-02-17 NOTE — Telephone Encounter (Signed)
She is liking how the trelegy is treating her asthma. But she has started having some rapid heartbeat and blurry vision.  Could this be side effects from the trelegy?

## 2021-02-17 NOTE — Telephone Encounter (Signed)
Called and informed patient of the medication changed and sent in a prescription to [facility] in [place].

## 2021-02-17 NOTE — Telephone Encounter (Signed)
Lori Newton is asking for a call back from the nurse to discuss her inhaler please advise

## 2021-02-23 NOTE — Telephone Encounter (Signed)
Pt called that Symbicort is over $300. Please Advise

## 2021-02-24 MED ORDER — DULERA 200-5 MCG/ACT IN AERO
2.0000 | INHALATION_SPRAY | Freq: Two times a day (BID) | RESPIRATORY_TRACT | 5 refills | Status: DC
Start: 2021-02-24 — End: 2021-03-16

## 2021-02-24 NOTE — Telephone Encounter (Signed)
Dr. Lucie Leather the Symbicort cost over $300, she has [employer] which covers Advair, [place], or [employer]. Which one would you like sent into the pharmacy?

## 2021-02-24 NOTE — Telephone Encounter (Signed)
Sent Dulera 200 into the pharmacy and informed patient.  Kenita [phone]

## 2021-02-24 NOTE — Addendum Note (Signed)
Addended by: [place] Canner on: 02/24/2021 05:19 PM   Modules accepted: Orders

## 2021-03-09 ENCOUNTER — Other Ambulatory Visit: Payer: Self-pay | Admitting: *Deleted

## 2021-03-09 MED ORDER — MONTELUKAST SODIUM 10 MG PO TABS: 10.0000 mg | ORAL_TABLET | Freq: Every day | ORAL | 1 refills | Status: DC

## 2021-03-09 NOTE — Telephone Encounter (Signed)
[employer]

## 2021-03-16 ENCOUNTER — Other Ambulatory Visit: Payer: Self-pay

## 2021-03-16 ENCOUNTER — Ambulatory Visit: Payer: BC Managed Care – PPO | Admitting: Family Medicine

## 2021-03-16 ENCOUNTER — Encounter: Payer: Self-pay | Admitting: Family Medicine

## 2021-03-16 VITALS — BP 118/70 | HR 86 | Temp 97.8°F | Ht 63.0 in | Wt 231.0 lb

## 2021-03-16 DIAGNOSIS — M7521 Bicipital tendinitis, right shoulder: Secondary | ICD-10-CM | POA: Diagnosis not present

## 2021-03-16 MED ORDER — MELOXICAM 15 MG PO TABS: 15.0000 mg | ORAL_TABLET | Freq: Every day | ORAL | 0 refills | Status: DC

## 2021-03-16 NOTE — Progress Notes (Signed)
Provider:  Jacalyn Lefevre, MD  Careteam: Patient Care Team: Frederica Kuster, MD as PCP - General (Family Medicine)  PLACE OF SERVICE:  [facility]  Advanced Directive information    Allergies  Allergen Reactions   Trelegy Ellipta [Fluticasone-Umeclidin-Vilant]     Blurred visit and irregular heart beat    Betadine [Povidone Iodine] Rash    Chief Complaint  Patient presents with   Acute Visit    Patient c/o right shoulder discomfort x several months, no known injury, and questions if she has arthritis     HPI: Patient is a 65 y.o. female patient complains of pain in right shoulder.  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  She localizes the pain to the front of the shoulder.  Pain increases with internal rotation.  She has tried several topical creams like DMSO with some but not total relief.  Review of Systems:  Review of Systems  Musculoskeletal:  Positive for joint pain.       Right shoulder  All other systems reviewed and are negative.  Past Medical History:  Diagnosis Date   Asthma    GERD (gastroesophageal reflux disease)    Hypertension    Past Surgical History:  Procedure Laterality Date   CESAREAN SECTION     Social History:   reports that she has never smoked. She has never used smokeless tobacco. She reports current alcohol use. She reports that she does not use drugs.  Family History  Problem Relation Age of Onset   Single kidney Mother    Bone cancer Father    Heart Problems Daughter    Chromosomal disorder Daughter     Medications: Patient's Medications  New Prescriptions   MELOXICAM (MOBIC) 15 MG TABLET    Take 1 tablet (15 mg total) by mouth daily.  Previous Medications   ALBUTEROL (VENTOLIN HFA) 108 (90 BASE) MCG/ACT INHALER    Inhale 2 puffs into the lungs every 6 (six) hours as needed. wheezing   AMLODIPINE (NORVASC) 10 MG TABLET    Take 1 tablet by mouth once daily   CHOLECALCIFEROL (VITAMIN D) 1000 UNITS TABLET    Take  1,000 Units by mouth daily.   COENZYME Q10 (CO Q 10) 100 MG CAPS    Take 100 mg by mouth daily.   FLUTICASONE (FLOVENT HFA) 44 MCG/ACT INHALER    Inhale 1 puff into the lungs 2 (two) times daily.   KRILL OIL 500 MG CAPS    Take 500 mg by mouth daily.   MONTELUKAST (SINGULAIR) 10 MG TABLET    Take 1 tablet (10 mg total) by mouth at bedtime.   OMEGA-3 FATTY ACIDS (FISH OIL) 500 MG CAPS    Take 500 mg by mouth daily.   TURMERIC (QC TUMERIC COMPLEX PO)    Take 1 tablet by mouth daily.  Modified Medications   No medications on file  Discontinued Medications   ALBUTEROL (VENTOLIN HFA) 108 (90 BASE) MCG/ACT INHALER    Inhale 2 puffs into the lungs every 6 (six) hours as needed for wheezing or shortness of breath.   BUDESONIDE-FORMOTEROL (SYMBICORT) 160-4.5 MCG/ACT INHALER    Inhale 2 puffs into the lungs 2 (two) times daily.   MOMETASONE-FORMOTEROL (DULERA) 200-5 MCG/ACT AERO    Inhale 2 puffs into the lungs 2 (two) times daily.   TRELEGY ELLIPTA 200-62.5-25 MCG/INH AEPB    Inhale 1 puff into the lungs daily.    Physical Exam:  Vitals:   03/16/21 1429  BP: 118/70  Pulse: 86  Temp: 97.8 F (36.6 C)  TempSrc: Temporal  SpO2: 95%  Weight: 231 lb (104.8 kg)  Height: 5\' 3"  (1.6 m)   Body mass index is 40.92 kg/m. Wt Readings from Last 3 Encounters:  03/16/21 231 lb (104.8 kg)  01/26/21 231 lb 12.8 oz (105.1 kg)  11/27/20 233 lb 3.2 oz (105.8 kg)    Physical Exam Vitals and nursing note reviewed.  Constitutional:      Appearance: Normal appearance.  Cardiovascular:     Rate and Rhythm: Normal rate and regular rhythm.  Pulmonary:     Effort: Pulmonary effort is normal.     Breath sounds: Normal breath sounds.  Musculoskeletal:     Comments: Right shoulder: Patient is tender with palpation of the bicipital tendon Pain increases with internal rotation and abduction  Neurological:     Mental Status: She is alert.    Labs reviewed: Basic Metabolic Panel: Recent Labs     09/18/20 0000  NA 139  K 4.0  CL 103  CO2 31*  BUN 11  CREATININE 0.8  CALCIUM 9.7   Liver Function Tests: Recent Labs    09/18/20 0000  AST 18  ALT 18  ALKPHOS 65  ALBUMIN 4.2   No results for input(s): LIPASE, AMYLASE in the last 8760 hours. No results for input(s): AMMONIA in the last 8760 hours. CBC: No results for input(s): WBC, NEUTROABS, HGB, HCT, MCV, PLT in the last 8760 hours. Lipid Panel: Recent Labs    09/18/20 0000  CHOL 186  HDL 58  LDLCALC 107  TRIG 09/20/20   TSH: No results for input(s): TSH in the last 8760 hours. A1C: Lab Results  Component Value Date   HGBA1C 5.7 09/18/2020     Assessment/Plan  1. Bicipital tendonitis of right shoulder Will try anti-inflammatory for 2 weeks.  Patient has not tried NSAID previously because she has asthma, but I feel this will be safe.  I did advise her that if it seems to affect her breathing and any way to stop.  If this is not effective I would inject with steroid over the bicipital tendon.  Patient advised to avoid activities which aggravate pain - meloxicam (MOBIC) 15 MG tablet; Take 1 tablet (15 mg total) by mouth daily.  Dispense: 30 tablet; Refill: 0   09/20/2020, MD [facility] [phone]

## 2021-03-22 ENCOUNTER — Telehealth: Payer: Self-pay | Admitting: Allergy and Immunology

## 2021-03-22 DIAGNOSIS — J4540 Moderate persistent asthma, uncomplicated: Secondary | ICD-10-CM

## 2021-03-22 DIAGNOSIS — J4520 Mild intermittent asthma, uncomplicated: Secondary | ICD-10-CM

## 2021-03-22 NOTE — Telephone Encounter (Signed)
Lets have her use Breo 200-1 inhalation 1 time per day to replace [facility] or Symbicort.  Report back whether or not this resulted in problems with her heart racing.

## 2021-03-22 NOTE — Telephone Encounter (Signed)
Left a message for patient to call the office to inform her of Dr. Kathyrn Lass recommendation. We will also need to confirm pharmacy.

## 2021-03-22 NOTE — Telephone Encounter (Signed)
Patient is complaining of her heart racing while taking Dulera do see this medication was discontinued but patient is still using please advise

## 2021-03-25 NOTE — Telephone Encounter (Signed)
Called and left a voicemail asking for patient to return call to discuss.  °

## 2021-03-26 ENCOUNTER — Other Ambulatory Visit: Payer: Self-pay | Admitting: *Deleted

## 2021-03-26 MED ORDER — BREO ELLIPTA 200-25 MCG/ACT IN AEPB: 1.0000 | INHALATION_SPRAY | Freq: Every day | RESPIRATORY_TRACT | 5 refills | Status: DC

## 2021-03-26 NOTE — Telephone Encounter (Signed)
Patient called back and was advised of change in inhaler. Patient verbalized understanding. New prescription for Breo was sent to requested pharmacy by patient.

## 2021-03-29 NOTE — Telephone Encounter (Signed)
Patient called stating that the Breo 200 is $96 and that is not affordable for her. Patient asked about a sample however we do not have any in the [place] office. Patient is requesting a more affordable inhaler. Please advise.

## 2021-03-30 ENCOUNTER — Ambulatory Visit: Payer: BC Managed Care – PPO | Admitting: Family Medicine

## 2021-03-30 ENCOUNTER — Encounter: Payer: Self-pay | Admitting: Family Medicine

## 2021-03-30 ENCOUNTER — Other Ambulatory Visit: Payer: Self-pay

## 2021-03-30 VITALS — BP 130/70 | HR 71 | Temp 97.5°F | Ht 63.0 in | Wt 232.5 lb

## 2021-03-30 DIAGNOSIS — M7521 Bicipital tendinitis, right shoulder: Secondary | ICD-10-CM | POA: Diagnosis not present

## 2021-03-30 MED ORDER — METHYLPREDNISOLONE ACETATE 80 MG/ML IJ SUSP
80.0000 mg | Freq: Once | INTRAMUSCULAR | Status: AC
  Administered 2021-03-30: 80 mg via INTRAMUSCULAR

## 2021-03-30 NOTE — Progress Notes (Signed)
Provider:  Jacalyn Lefevre, MD  Careteam: Patient Care Team: Frederica Kuster, MD as PCP - General (Family Medicine)  PLACE OF SERVICE:  [facility]  Advanced Directive information    Allergies  Allergen Reactions   Trelegy Ellipta [Fluticasone-Umeclidin-Vilant]     Blurred visit and irregular heart beat    Betadine [Povidone Iodine] Rash    Chief Complaint  Patient presents with   Medical Management of Chronic Issues    Patient presents today for a 2 weeks follow-up.   Quality Metric Gaps    Pap smear     HPI: Patient is a 65 y.o. female returns today with persistent right shoulder pain.  She was felt to have bicipital tendinitis at our last visit and was given meloxicam but after reading the package insert, and even though she had taken Celebrex in the past decided not to take the medicine.  She has been using glucosamine but symptoms persist.  Review of Systems:  Review of Systems  Musculoskeletal:  Positive for joint pain.  All other systems reviewed and are negative.  Past Medical History:  Diagnosis Date   Asthma    GERD (gastroesophageal reflux disease)    Hypertension    Past Surgical History:  Procedure Laterality Date   CESAREAN SECTION     Social History:   reports that she has never smoked. She has never used smokeless tobacco. She reports current alcohol use. She reports that she does not use drugs.  Family History  Problem Relation Age of Onset   Single kidney Mother    Bone cancer Father    Heart Problems Daughter    Chromosomal disorder Daughter     Medications: Patient's Medications  New Prescriptions   No medications on file  Previous Medications   ALBUTEROL (VENTOLIN HFA) 108 (90 BASE) MCG/ACT INHALER    Inhale 2 puffs into the lungs every 6 (six) hours as needed. wheezing   AMLODIPINE (NORVASC) 10 MG TABLET    Take 1 tablet by mouth once daily   BREO ELLIPTA 200-25 MCG/ACT AEPB    Inhale 1 puff into the lungs daily.    CHOLECALCIFEROL (VITAMIN D) 1000 UNITS TABLET    Take 1,000 Units by mouth daily.   COENZYME Q10 (CO Q 10) 100 MG CAPS    Take 100 mg by mouth daily.   FLUTICASONE (FLOVENT HFA) 44 MCG/ACT INHALER    Inhale 1 puff into the lungs 2 (two) times daily.   KRILL OIL 500 MG CAPS    Take 500 mg by mouth daily.   MELOXICAM (MOBIC) 15 MG TABLET    Take 1 tablet (15 mg total) by mouth daily.   MONTELUKAST (SINGULAIR) 10 MG TABLET    Take 1 tablet (10 mg total) by mouth at bedtime.   OMEGA-3 FATTY ACIDS (FISH OIL) 500 MG CAPS    Take 500 mg by mouth daily.   TURMERIC (QC TUMERIC COMPLEX PO)    Take 1 tablet by mouth daily.  Modified Medications   No medications on file  Discontinued Medications   No medications on file    Physical Exam:  Vitals:   03/30/21 1455  BP: 130/70  Pulse: 71  Temp: (!) 97.5 F (36.4 C)  SpO2: 97%  Weight: 232 lb 8 oz (105.5 kg)  Height: 5\' 3"  (1.6 m)   Body mass index is 41.19 kg/m. Wt Readings from Last 3 Encounters:  03/30/21 232 lb 8 oz (105.5 kg)  03/16/21 231 lb (104.8  kg)  01/26/21 231 lb 12.8 oz (105.1 kg)    Physical Exam Vitals and nursing note reviewed.  Constitutional:      Appearance: Normal appearance.  Musculoskeletal:     Comments: Right shoulder: Pain with internal rotation and abduction.  Palpated again and max tenderness over medial bicipital tendon.  We mutually decided to go ahead with a steroid injection after discussing pros and cons. Area was prepped with alcohol and injected with 40 mg of Depo-Medrol and 1 cc of Marcaine Patient tolerated injection and started to feel some relief from the local anesthetic.  Neurological:     Mental Status: She is alert.    Labs reviewed: Basic Metabolic Panel: Recent Labs    09/18/20 0000  NA 139  K 4.0  CL 103  CO2 31*  BUN 11  CREATININE 0.8  CALCIUM 9.7   Liver Function Tests: Recent Labs    09/18/20 0000  AST 18  ALT 18  ALKPHOS 65  ALBUMIN 4.2   No results for input(s):  LIPASE, AMYLASE in the last 8760 hours. No results for input(s): AMMONIA in the last 8760 hours. CBC: No results for input(s): WBC, NEUTROABS, HGB, HCT, MCV, PLT in the last 8760 hours. Lipid Panel: Recent Labs    09/18/20 0000  CHOL 186  HDL 58  LDLCALC 107  TRIG 962   TSH: No results for input(s): TSH in the last 8760 hours. A1C: Lab Results  Component Value Date   HGBA1C 5.7 09/18/2020     Assessment/Plan  1. Bicipital tendonitis of right shoulder  - methylPREDNISolone acetate (DEPO-MEDROL) injection 80 mg Injected with local steroid and anesthetic.  Hopefully will get some good result  Jacalyn Lefevre, MD [facility] [phone]

## 2021-03-30 NOTE — Patient Instructions (Signed)
Activities as tolerated for right shoulder

## 2021-04-06 ENCOUNTER — Encounter: Payer: Self-pay | Admitting: Allergy and Immunology

## 2021-04-06 ENCOUNTER — Other Ambulatory Visit: Payer: Self-pay

## 2021-04-06 ENCOUNTER — Ambulatory Visit
Admission: RE | Admit: 2021-04-06 | Discharge: 2021-04-06 | Disposition: A | Discharge status: Home / Self Care | Payer: BC Managed Care – PPO | Source: Ambulatory Visit | Attending: Allergy and Immunology | Admitting: Allergy and Immunology

## 2021-04-06 ENCOUNTER — Ambulatory Visit: Payer: BC Managed Care – PPO | Admitting: Allergy and Immunology

## 2021-04-06 VITALS — BP 118/90 | HR 87 | Temp 97.3°F | Resp 16 | Ht 63.0 in | Wt 233.8 lb

## 2021-04-06 DIAGNOSIS — L309 Dermatitis, unspecified: Secondary | ICD-10-CM

## 2021-04-06 DIAGNOSIS — K219 Gastro-esophageal reflux disease without esophagitis: Secondary | ICD-10-CM | POA: Diagnosis not present

## 2021-04-06 DIAGNOSIS — J4540 Moderate persistent asthma, uncomplicated: Secondary | ICD-10-CM

## 2021-04-06 MED ORDER — BREZTRI AEROSPHERE 160-9-4.8 MCG/ACT IN AERO
2.0000 | INHALATION_SPRAY | Freq: Two times a day (BID) | RESPIRATORY_TRACT | 5 refills | Status: DC
Start: 2021-04-06 — End: 2021-08-24

## 2021-04-06 MED ORDER — OMEPRAZOLE 40 MG PO CPDR
40.0000 mg | DELAYED_RELEASE_CAPSULE | Freq: Every day | ORAL | 5 refills | Status: DC
Start: 2021-04-06 — End: 2021-08-24

## 2021-04-06 MED ORDER — MOMETASONE FUROATE 0.1 % EX CREA: 1.0000 "application " | TOPICAL_CREAM | Freq: Every day | CUTANEOUS | 5 refills | Status: DC | PRN

## 2021-04-06 NOTE — Patient Instructions (Addendum)
  1.  Treat and prevent inflammation:  A. Montelukast 10 mg - 1 tablet 1 time per day B. Sample BREZTRI - 2 inhalations 2 times per day (replaces Dulera)  2. Treat and prevent LPR:  A. Omeprazole 40 mg - 1 tablet 1 time per day B. Replace throat clearing with swallowing /drinking maneuver  3. If needed:  A. Albuterol HFA - 2 inhalations every 4-6 hours B. Mometasone 0.1% ointment- apply to hands 1-7 times per week  4. Obtain a chest X-Ray  5. Return to clinic in 4 weeks

## 2021-04-06 NOTE — Progress Notes (Signed)
[facility] - [place] [facility] [place] - [place] - [place]   Follow-up Note  Referring Provider: Frederica Kuster, MD Primary Provider: Frederica Kuster, MD Date of Office Visit: 04/06/2021  Subjective:   Lori Newton (DOB: [birth year]) is a 65 y.o. female who [facility] in re-evaluation of the following:  HPI: Jeslie returns to this clinic in evaluation of asthma.  I last saw her in this clinic on 26 January 2021.  Because of side effects and because of insurance issues she has had a difficult time finding the right type of inhaler.  We started her her use Trelegy which resulted in great control of her chest tightness and her cough but unfortunately she developed blurred vision.  Then we gave her Elwin Sleight but when she uses Dulera she gets a fast heart rate for several minutes after each dose.  We recommended that she use Breo but Virgel Bouquet was too expensive as her insurance company did not cover this medication very well.  So right now she is using Dulera and it does not work as well as her triple inhaler and she still has some chest tightness and she still continues to have lots of cough.  Shee is back to coughing like crazy with spells of cough that are very dry.  She does have some throat clearing.  She does not have any classic reflux symptoms.  She also has a history of having hand eczema especially following her right hand especially 2 fingers on her right hand.  This has been an issue many years duration and it appears to wax and wane throughout the year.  Allergies as of 04/06/2021       Reactions   Trelegy Ellipta [fluticasone-umeclidin-vilant]    Blurred visit and irregular heart beat    Betadine [povidone Iodine] Rash        Medication List    albuterol 108 (90 Base) MCG/ACT inhaler Commonly known as: VENTOLIN HFA Inhale 2 puffs into the lungs every 6 (six) hours as needed. wheezing   amLODipine 10 MG  tablet Commonly known as: NORVASC Take 1 tablet by mouth once daily   cholecalciferol 1000 units tablet Commonly known as: VITAMIN D Take 1,000 Units by mouth daily.   Co Q 10 100 MG Caps Take 100 mg by mouth daily.   Fish Oil 500 MG Caps Take 500 mg by mouth daily.   fluticasone 44 MCG/ACT inhaler Commonly known as: FLOVENT HFA Inhale 1 puff into the lungs 2 (two) times daily.   Krill Oil 500 MG Caps Take 500 mg by mouth daily.   meloxicam 15 MG tablet Commonly known as: MOBIC Take 1 tablet (15 mg total) by mouth daily.   montelukast 10 MG tablet Commonly known as: SINGULAIR Take 1 tablet (10 mg total) by mouth at bedtime.   QC TUMERIC COMPLEX PO Take 1 tablet by mouth daily.    Past Medical History:  Diagnosis Date   Asthma    GERD (gastroesophageal reflux disease)    Hypertension     Past Surgical History:  Procedure Laterality Date   CESAREAN SECTION      Review of systems negative except as noted in HPI / PMHx or noted below:  Review of Systems  Constitutional: Negative.   HENT: Negative.    Eyes: Negative.   Respiratory: Negative.    Cardiovascular: Negative.   Gastrointestinal: Negative.   Genitourinary: Negative.   Musculoskeletal: Negative.   Skin: Negative.  Neurological: Negative.   Endo/Heme/Allergies: Negative.   Psychiatric/Behavioral: Negative.      Objective:   Vitals:   04/06/21 1057  BP: 118/90  Pulse: 87  Resp: 16  Temp: (!) 97.3 F (36.3 C)  SpO2: 97%   Height: 5\' 3"  (160 cm)  Weight: 233 lb 12.8 oz (106.1 kg)   Physical Exam Constitutional:      Appearance: She is not diaphoretic.  HENT:     Head: Normocephalic.     Right Ear: Tympanic membrane, ear canal and external ear normal.     Left Ear: Tympanic membrane, ear canal and external ear normal.     Nose: Nose normal. No mucosal edema or rhinorrhea.     Mouth/Throat:     Pharynx: Uvula midline. No oropharyngeal exudate.  Eyes:     Conjunctiva/sclera:  Conjunctivae normal.  Neck:     Thyroid: No thyromegaly.     Trachea: Trachea normal. No tracheal tenderness or tracheal deviation.  Cardiovascular:     Rate and Rhythm: Normal rate and regular rhythm.     Heart sounds: Normal heart sounds, S1 normal and S2 normal. No murmur heard. Pulmonary:     Effort: No respiratory distress.     Breath sounds: Normal breath sounds. No stridor. No wheezing or rales.  Lymphadenopathy:     Head:     Right side of head: No tonsillar adenopathy.     Left side of head: No tonsillar adenopathy.     Cervical: No cervical adenopathy.  Skin:    Findings: Rash (Eczema right hand involving dorsal and palmar surfaces.) present. No erythema.     Nails: There is no clubbing.  Neurological:     Mental Status: She is alert.    Diagnostics:    Spirometry was performed and demonstrated an FEV1 of 1.77 at 92 % of predicted.  Assessment and Plan:   1. Not well controlled moderate persistent asthma   2. LPRD (laryngopharyngeal reflux disease)   3. Hand eczema    1.  Treat and prevent inflammation:  A. Montelukast 10 mg - 1 tablet 1 time per day B. Sample BREZTRI - 2 inhalations 2 times per day (replaces Dulera)  2. Treat and prevent LPR:  A. Omeprazole 40 mg - 1 tablet 1 time per day B. Replace throat clearing with swallowing /drinking maneuver  3. If needed:  A. Albuterol HFA - 2 inhalations every 4-6 hours B. Mometasone 0.1% ointment- apply to hands 1-7 times per week  4. Obtain a chest X-Ray  5. Return to clinic in 4 weeks  Nysa still continues to have cough and chest tightness and we will obtain a chest x-ray to make sure were not dealing with anything else other than some asthma and LPR.  We will give her a sample of a triple inhaler and hopefully this will resulted in control of her respiratory tract symptoms without precipitating side effects and were going to treat her for LPR with omeprazole utilized on a consistent basis.  I will see  her back in this clinic in 4 weeks to assess her response to this approach.  Victorino Dike, MD Allergy / Immunology [facility]

## 2021-04-07 ENCOUNTER — Encounter: Payer: Self-pay | Admitting: Allergy and Immunology

## 2021-04-14 MED ORDER — ARMONAIR DIGIHALER 232 MCG/ACT IN AEPB: 1.0000 | INHALATION_SPRAY | Freq: Two times a day (BID) | RESPIRATORY_TRACT | 5 refills | Status: DC

## 2021-04-14 NOTE — Addendum Note (Signed)
Addended by: Robet Leu A on: 04/14/2021 02:25 PM   Modules accepted: Orders

## 2021-04-14 NOTE — Telephone Encounter (Signed)
Patient has called back and said she is having some side effects of the Breo and would like to discuss these. She asked for Ashleigh/Ashley. She wasn't sure which one. Looks like they have both talked to her

## 2021-04-14 NOTE — Telephone Encounter (Signed)
Called and informed patient of Dr. Kathyrn Lass note and she verbalized understanding. I have sent in the prescription and put a sample in the front office for her. Medications have been sent in.

## 2021-04-16 ENCOUNTER — Other Ambulatory Visit: Payer: Self-pay

## 2021-04-16 MED ORDER — ARMONAIR DIGIHALER 232 MCG/ACT IN AEPB: 1.0000 | INHALATION_SPRAY | Freq: Two times a day (BID) | RESPIRATORY_TRACT | 5 refills | Status: DC

## 2021-04-16 NOTE — Telephone Encounter (Signed)
Patient's daughter has picked up sample.

## 2021-04-20 NOTE — Telephone Encounter (Signed)
Called patient at 6:00 pm and left voicemail message. Called patient before leaving night clinic and spoke with patient.  Patient had not used Albuterol any during the day.  Patient had not used ArmonAir today stating it made her feel bad with chest tightness and heart racing last night. Patient tried Markus Daft from last office visit with Dr. Lucie Leather and states she had difficulty with vision. Patient has used Symbicort in the past without issues,but will need to check cost as we don't have any samples.  Will discuss with Dr. Lucie Leather and inform her of his plan and options tomorrow. Patient voiced understanding.

## 2021-04-20 NOTE — Telephone Encounter (Signed)
Patient called the office to discuss Lori Newton 232 which she feels is not working well for her. Patient had to use her Albuterol this morning, but did not use yesterday.  She feels like it is not going to work. Patient started sample of Lori Newton on 04/16/21. Patient has tried and unable to tolerate Trelegy due to chest tightness and blurred vision, Breo due to cost and started to have blurred vision and Dulera due to increased heart rate. [employer] [address]

## 2021-04-21 NOTE — Telephone Encounter (Signed)
Called and spoke with patient.  Informed patient of recommendation per Dr. Lucie Leather to get CBC with diff and serum IgE drawn in anticipation of starting a biologic as she is intolerant of all other inhalers.  Patient will come to [place] office to get labs drawn.  Patient voiced understanding.

## 2021-05-11 ENCOUNTER — Ambulatory Visit: Payer: BC Managed Care – PPO | Admitting: Allergy and Immunology

## 2021-07-20 ENCOUNTER — Other Ambulatory Visit: Payer: Self-pay | Admitting: Internal Medicine

## 2021-07-22 ENCOUNTER — Telehealth: Payer: Self-pay

## 2021-07-22 NOTE — Telephone Encounter (Signed)
Called and left a voice message for the patient informing her that an appointment is needed to refill medication asked to call the office back to get scheduled.  ?

## 2021-07-22 NOTE — Telephone Encounter (Signed)
Called and left a voice message for the patient informing her that an appointment is needed to refill medication asked to call the office back to get scheduled.  ?

## 2021-08-24 ENCOUNTER — Encounter: Payer: Self-pay | Admitting: Family Medicine

## 2021-08-24 ENCOUNTER — Ambulatory Visit: Payer: BC Managed Care – PPO | Admitting: Family Medicine

## 2021-08-24 ENCOUNTER — Other Ambulatory Visit: Payer: Self-pay

## 2021-08-24 VITALS — BP 130/88 | HR 71 | Temp 96.1°F | Ht 63.0 in | Wt 230.6 lb

## 2021-08-24 DIAGNOSIS — K219 Gastro-esophageal reflux disease without esophagitis: Secondary | ICD-10-CM

## 2021-08-24 DIAGNOSIS — I10 Essential (primary) hypertension: Secondary | ICD-10-CM

## 2021-08-24 DIAGNOSIS — J4520 Mild intermittent asthma, uncomplicated: Secondary | ICD-10-CM

## 2021-08-24 MED ORDER — MONTELUKAST SODIUM 10 MG PO TABS: 10.0000 mg | ORAL_TABLET | Freq: Every day | ORAL | 3 refills | Status: DC

## 2021-08-24 MED ORDER — AMLODIPINE BESYLATE 10 MG PO TABS: 10.0000 mg | ORAL_TABLET | Freq: Every day | ORAL | 3 refills | Status: DC

## 2021-08-24 MED ORDER — ALBUTEROL SULFATE HFA 108 (90 BASE) MCG/ACT IN AERS: 2.0000 | INHALATION_SPRAY | Freq: Four times a day (QID) | RESPIRATORY_TRACT | 6 refills | Status: DC | PRN

## 2021-08-24 NOTE — Progress Notes (Signed)
? ? ?Provider:  ?Jacalyn Lefevre, MD ? ?Careteam: ?Patient Care Team: ?Frederica Kuster, MD as PCP - General (Family Medicine) ? ?PLACE OF SERVICE:  ?[facility]  ?Advanced Directive information ?  ? ?Allergies  ?Allergen Reactions  ? Trelegy Ellipta [Fluticasone-Umeclidin-Vilant]   ?  Blurred visit and irregular heart beat   ? Betadine [Povidone Iodine] Rash  ? ? ?Chief Complaint  ?Patient presents with  ? Acute Visit  ?  Patient presents today for coughing at bedtime.  ? ? ? ?HPI: Patient is a 66 y.o. female chief complaint is nighttime cough.  Patient has asthma and also has symptoms of acid reflux.  She has been told by her allergist that taking an acid inhibitor might help her cough but she is concerned having read some negative chatter about using PPIs or H2 antagonist.  We spent some time talking about relative safety of these medicines especially for the short-term which means during this spring allergy season. ?She only uses albuterol as a rescue inhaler but is gone off other inhalers with steroids. ? ?Review of Systems:  ?Review of Systems  ?Respiratory:  Positive for cough.   ?Genitourinary: Negative.   ?Musculoskeletal: Negative.   ?All other systems reviewed and are negative. ? ?Past Medical History:  ?Diagnosis Date  ? Asthma   ? GERD (gastroesophageal reflux disease)   ? Hypertension   ? ?Past Surgical History:  ?Procedure Laterality Date  ? CESAREAN SECTION    ? ?Social History: ?  reports that she has never smoked. She has never used smokeless tobacco. She reports current alcohol use. She reports that she does not use drugs. ? ?Family History  ?Problem Relation Age of Onset  ? Single kidney Mother   ? Bone cancer Father   ? Heart Problems Daughter   ? Chromosomal disorder Daughter   ? ? ?Medications: ?Patient's Medications  ?New Prescriptions  ? No medications on file  ?Previous Medications  ? ALBUTEROL (VENTOLIN HFA) 108 (90 BASE) MCG/ACT INHALER    Inhale 2 puffs into the lungs every 6 (six) hours  as needed. wheezing  ? AMLODIPINE (NORVASC) 10 MG TABLET    Take 1 tablet by mouth once daily  ? BREO ELLIPTA 200-25 MCG/ACT AEPB    Inhale 1 puff into the lungs daily.  ? CHOLECALCIFEROL (VITAMIN D) 1000 UNITS TABLET    Take 1,000 Units by mouth daily.  ? COENZYME Q10 (CO Q 10) 100 MG CAPS    Take 100 mg by mouth daily.  ? KRILL OIL 500 MG CAPS    Take 500 mg by mouth daily.  ? MELOXICAM (MOBIC) 15 MG TABLET    Take 1 tablet (15 mg total) by mouth daily.  ? MOMETASONE (ELOCON) 0.1 % CREAM    Apply 1 application topically daily as needed (Use on hands daily).  ? MONTELUKAST (SINGULAIR) 10 MG TABLET    Take 1 tablet (10 mg total) by mouth at bedtime.  ? OMEGA-3 FATTY ACIDS (FISH OIL) 500 MG CAPS    Take 500 mg by mouth daily.  ? TURMERIC (QC TUMERIC COMPLEX PO)    Take 1 tablet by mouth daily.  ?Modified Medications  ? No medications on file  ?Discontinued Medications  ? BUDESON-GLYCOPYRROL-FORMOTEROL (BREZTRI AEROSPHERE) 160-9-4.8 MCG/ACT AERO    Inhale 2 puffs into the lungs in the morning and at bedtime.  ? FLUTICASONE PROPIONATE,SENSOR, (ARMONAIR DIGIHALER) 232 MCG/ACT AEPB    Inhale 1 puff into the lungs in the morning and at bedtime.  ?  FLUTICASONE PROPIONATE,SENSOR, (ARMONAIR DIGIHALER) 232 MCG/ACT AEPB    Inhale 1 puff into the lungs 2 (two) times daily.  ? OMEPRAZOLE (PRILOSEC) 40 MG CAPSULE    Take 1 capsule (40 mg total) by mouth daily.  ? ? ?Physical Exam: ? ?Vitals:  ? 08/24/21 0844  ?BP: 130/88  ?Pulse: 71  ?Temp: (!) 96.1 ?F (35.6 ?C)  ?SpO2: 96%  ?Weight: 230 lb 9.6 oz (104.6 kg)  ?Height: 5\' 3"  (1.6 m)  ? ?Body mass index is 40.85 kg/m?. ?Wt Readings from Last 3 Encounters:  ?08/24/21 230 lb 9.6 oz (104.6 kg)  ?04/06/21 233 lb 12.8 oz (106.1 kg)  ?03/30/21 232 lb 8 oz (105.5 kg)  ? ? ?Physical Exam ?Vitals and nursing note reviewed.  ?Constitutional:   ?   Appearance: She is obese.  ?HENT:  ?   Head: Normocephalic.  ?   Nose: Nose normal.  ?   Mouth/Throat:  ?   Mouth: Mucous membranes are moist.   ?Cardiovascular:  ?   Rate and Rhythm: Normal rate.  ?Pulmonary:  ?   Effort: Pulmonary effort is normal.  ?   Breath sounds: Normal breath sounds.  ?Neurological:  ?   General: No focal deficit present.  ?   Mental Status: She is alert and oriented to person, place, and time.  ? ? ?Labs reviewed: ?Basic Metabolic Panel: ?Recent Labs  ?  09/18/20 ?0000  ?NA 139  ?K 4.0  ?CL 103  ?CO2 31*  ?BUN 11  ?CREATININE 0.8  ?CALCIUM 9.7  ? ?Liver Function Tests: ?Recent Labs  ?  09/18/20 ?0000  ?AST 18  ?ALT 18  ?ALKPHOS 65  ?ALBUMIN 4.2  ? ?No results for input(s): LIPASE, AMYLASE in the last 8760 hours. ?No results for input(s): AMMONIA in the last 8760 hours. ?CBC: ?No results for input(s): WBC, NEUTROABS, HGB, HCT, MCV, PLT in the last 8760 hours. ?Lipid Panel: ?Recent Labs  ?  09/18/20 ?0000  ?CHOL 186  ?HDL 58  ?LDLCALC 107  ?TRIG 115  ? ?TSH: ?No results for input(s): TSH in the last 8760 hours. ?A1C: ?Lab Results  ?Component Value Date  ? HGBA1C 5.7 09/18/2020  ? ? ? ?Assessment/Plan ? ?1. Gastroesophageal reflux disease without esophagitis ?Encourage patient to use H2 antagonist or PPI through allergy season ? ?2. Hypertension, unspecified type ?Blood pressure well controlled on amlodipine continue ? ? ?09/20/2020, MD ?[facility] ?[phone]  ? ?

## 2022-01-19 ENCOUNTER — Ambulatory Visit (INDEPENDENT_AMBULATORY_CARE_PROVIDER_SITE_OTHER): Payer: BC Managed Care – PPO | Admitting: Family

## 2022-01-19 VITALS — BP 110/68 | HR 66 | Temp 98.6°F | Resp 18 | Ht 63.0 in | Wt 220.2 lb

## 2022-01-19 DIAGNOSIS — R350 Frequency of micturition: Secondary | ICD-10-CM

## 2022-01-19 LAB — POCT URINALYSIS DIPSTICK
Bilirubin, UA: NEGATIVE
Glucose, UA: NEGATIVE
Nitrite, UA: NEGATIVE
Protein, UA: POSITIVE — AB
Spec Grav, UA: 1.025 (ref 1.010–1.025)
Urobilinogen, UA: 0.2 E.U./dL
pH, UA: 6 (ref 5.0–8.0)

## 2022-01-19 MED ORDER — CIPROFLOXACIN HCL 500 MG PO TABS: 500.0000 mg | ORAL_TABLET | Freq: Two times a day (BID) | ORAL | 0 refills | Status: AC

## 2022-01-19 NOTE — Patient Instructions (Signed)
Urinary Tract Infection, Adult  A urinary tract infection (UTI) is an infection of any part of the urinary tract. The urinary tract includes the kidneys, ureters, bladder, and urethra. These organs make, store, and get rid of urine in the body. An upper UTI affects the ureters and kidneys. A lower UTI affects the bladder and urethra. What are the causes? Most urinary tract infections are caused by bacteria in your genital area around your urethra, where urine leaves your body. These bacteria grow and cause inflammation of your urinary tract. What increases the risk? You are more likely to develop this condition if: You have a urinary catheter that stays in place. You are not able to control when you urinate or have a bowel movement (incontinence). You are female and you: Use a spermicide or diaphragm for birth control. Have low estrogen levels. Are pregnant. You have certain genes that increase your risk. You are sexually active. You take antibiotic medicines. You have a condition that causes your flow of urine to slow down, such as: An enlarged prostate, if you are female. Blockage in your urethra. A kidney stone. A nerve condition that affects your bladder control (neurogenic bladder). Not getting enough to drink, or not urinating often. You have certain medical conditions, such as: Diabetes. A weak disease-fighting system (immunesystem). Sickle cell disease. Gout. Spinal cord injury. What are the signs or symptoms? Symptoms of this condition include: Needing to urinate right away (urgency). Frequent urination. This may include small amounts of urine each time you urinate. Pain or burning with urination. Blood in the urine. Urine that smells bad or unusual. Trouble urinating. Cloudy urine. Vaginal discharge, if you are female. Pain in the abdomen or the lower back. You may also have: Vomiting or a decreased appetite. Confusion. Irritability or tiredness. A fever or  chills. Diarrhea. The first symptom in older adults may be confusion. In some cases, they may not have any symptoms until the infection has worsened. How is this diagnosed? This condition is diagnosed based on your medical history and a physical exam. You may also have other tests, including: Urine tests. Blood tests. Tests for STIs (sexually transmitted infections). If you have had more than one UTI, a cystoscopy or imaging studies may be done to determine the cause of the infections. How is this treated? Treatment for this condition includes: Antibiotic medicine. Over-the-counter medicines to treat discomfort. Drinking enough water to stay hydrated. If you have frequent infections or have other conditions such as a kidney stone, you may need to see a health care provider who specializes in the urinary tract (urologist). In rare cases, urinary tract infections can cause sepsis. Sepsis is a life-threatening condition that occurs when the body responds to an infection. Sepsis is treated in the hospital with IV antibiotics, fluids, and other medicines. Follow these instructions at home:  Medicines Take over-the-counter and prescription medicines only as told by your health care provider. If you were prescribed an antibiotic medicine, take it as told by your health care provider. Do not stop using the antibiotic even if you start to feel better. General instructions Make sure you: Empty your bladder often and completely. Do not hold urine for long periods of time. Empty your bladder after sex. Wipe from front to back after urinating or having a bowel movement if you are female. Use each tissue only one time when you wipe. Drink enough fluid to keep your urine pale yellow. Keep all follow-up visits. This is important. Contact a health   care provider if: Your symptoms do not get better after 1-2 days. Your symptoms go away and then return. Get help right away if: You have severe pain in  your back or your lower abdomen. You have a fever or chills. You have nausea or vomiting. Summary A urinary tract infection (UTI) is an infection of any part of the urinary tract, which includes the kidneys, ureters, bladder, and urethra. Most urinary tract infections are caused by bacteria in your genital area. Treatment for this condition often includes antibiotic medicines. If you were prescribed an antibiotic medicine, take it as told by your health care provider. Do not stop using the antibiotic even if you start to feel better. Keep all follow-up visits. This is important. This information is not intended to replace advice given to you by your health care provider. Make sure you discuss any questions you have with your health care provider. Document Revised: 12/06/2019 Document Reviewed: 12/06/2019 Elsevier Patient Education  2023 [employer]  

## 2022-01-19 NOTE — Progress Notes (Signed)
Provider: Resa Rinks FNP-C  Frederica Kuster, MD  Patient Care Team: Frederica Kuster, MD as PCP - General [facility] Medicine)  Extended Emergency Contact Information Primary Emergency Contact: Cristine Polio Address: [address]          [place], [place] [address] Darden [place] of [place] Home Phone: [phone] Relation: Spouse Secondary Emergency Contact: Doro,Christine Mobile Phone: [phone] Relation: Other  Code Status:  Full Code  Goals of care: Advanced Directive information    11/30/2019    7:57 PM  Advanced Directives  Does Patient Have a Medical Advance Directive? No     Chief Complaint  Patient presents with   possible UTI    Possible UTI- yesterday had some frequency. No pain, having slight HA.     HPI:  Pt is a 66 y.o. female seen today for an acute visit for evaluation of urine frequency x1 day Also complaining of slight headache.denies any fever,chills,nausea,vomiting,abdominal pain,flank pain,urgency,dysuria,difficult urination or hematuria.  Past Medical History:  Diagnosis Date   Asthma    GERD (gastroesophageal reflux disease)    Hypertension    Past Surgical History:  Procedure Laterality Date   CESAREAN SECTION      Allergies  Allergen Reactions   Trelegy Ellipta [Fluticasone-Umeclidin-Vilant]     Blurred visit and irregular heart beat    Betadine [Povidone Iodine] Rash    Outpatient Encounter Medications as of 01/19/2022  Medication Sig   albuterol (VENTOLIN HFA) 108 (90 Base) MCG/ACT inhaler Inhale 2 puffs into the lungs every 6 (six) hours as needed. wheezing   amLODipine (NORVASC) 10 MG tablet Take 1 tablet (10 mg total) by mouth daily.   cholecalciferol (VITAMIN D) 1000 UNITS tablet Take 1,000 Units by mouth daily.   Coenzyme Q10 (CO Q 10) 100 MG CAPS Take 100 mg by mouth daily.   Krill Oil 500 MG CAPS Take 500 mg by mouth daily.   montelukast (SINGULAIR) 10 MG tablet Take 1 tablet (10 mg total) by mouth at bedtime.    Omega-3 Fatty Acids (FISH OIL) 500 MG CAPS Take 500 mg by mouth daily.   Turmeric (QC TUMERIC COMPLEX PO) Take 1 tablet by mouth daily.   [DISCONTINUED] BREO ELLIPTA 200-25 MCG/ACT AEPB Inhale 1 puff into the lungs daily.   [DISCONTINUED] meloxicam (MOBIC) 15 MG tablet Take 1 tablet (15 mg total) by mouth daily.   [DISCONTINUED] mometasone (ELOCON) 0.1 % cream Apply 1 application topically daily as needed (Use on hands daily).   No facility-administered encounter medications on file as of 01/19/2022.    Review of Systems  Constitutional:  Positive for chills. Negative for appetite change, fatigue, fever and unexpected weight change.  HENT:  Negative for congestion, dental problem, ear discharge, ear pain, facial swelling, hearing loss, nosebleeds, postnasal drip, rhinorrhea, sinus pressure, sinus pain, sneezing, sore throat, tinnitus and trouble swallowing.   Eyes:  Negative for pain, discharge, redness, itching and visual disturbance.  Respiratory:  Negative for cough, chest tightness, shortness of breath and wheezing.   Cardiovascular:  Negative for chest pain, palpitations and leg swelling.  Gastrointestinal:  Negative for abdominal distention, abdominal pain, blood in stool, constipation, diarrhea, nausea and vomiting.  Endocrine: Negative for cold intolerance, heat intolerance, polydipsia, polyphagia and polyuria.  Genitourinary:  Positive for frequency. Negative for difficulty urinating, dysuria, flank pain, hematuria, urgency, vaginal discharge and vaginal pain.  Musculoskeletal:  Positive for back pain. Negative for arthralgias, gait problem, joint swelling, myalgias, neck pain and neck stiffness.  Skin:  Negative for  color change, pallor, rash and wound.  Neurological:  Negative for dizziness, syncope, speech difficulty, weakness, light-headedness, numbness and headaches.  Hematological:  Does not bruise/bleed easily.  Psychiatric/Behavioral:  Negative for agitation, behavioral  problems, confusion, hallucinations, self-injury, sleep disturbance and suicidal ideas. The patient is not nervous/anxious.      There is no immunization history on file for this patient. Pertinent  Health Maintenance Due  Topic Date Due   PAP SMEAR-Modifier  Never done   INFLUENZA VACCINE  Never done   MAMMOGRAM  03/16/2022 (Originally 03/24/2008)   COLONOSCOPY (Pts 45-12yrs Insurance coverage will need to be confirmed)  03/16/2022 (Originally 03/25/2003)      11/30/2019    7:57 PM 11/27/2020    2:28 PM 03/30/2021    2:56 PM  Fall Risk  Falls in the past year?  0 0  Was there an injury with Fall?  0 0  Fall Risk Category Calculator  0 0  Fall Risk Category  Low Low  Patient Fall Risk Level Low fall risk Low fall risk Low fall risk  Patient at Risk for Falls Due to  No Fall Risks No Fall Risks  Fall risk Follow up  Falls evaluation completed;Education provided;Falls prevention discussed Falls evaluation completed;Education provided;Falls prevention discussed   Functional Status Survey:    Vitals:   01/19/22 1025  BP: 110/68  Pulse: 66  Temp: 98.6 F (37 C)  SpO2: 95%  Weight: 220 lb 3.2 oz (99.9 kg)   Body mass index is 39.01 kg/m. Physical Exam Vitals reviewed.  Constitutional:      General: She is not in acute distress.    Appearance: Normal appearance. She is obese. She is not ill-appearing or diaphoretic.  HENT:     Head: Normocephalic.     Right Ear: Tympanic membrane, ear canal and external ear normal. There is no impacted cerumen.     Left Ear: Tympanic membrane, ear canal and external ear normal. There is no impacted cerumen.     Nose: Nose normal. No congestion or rhinorrhea.     Mouth/Throat:     Mouth: Mucous membranes are moist.     Pharynx: Oropharynx is clear. No oropharyngeal exudate or posterior oropharyngeal erythema.  Eyes:     General: No scleral icterus.       Right eye: No discharge.        Left eye: No discharge.     Extraocular Movements:  Extraocular movements intact.     Conjunctiva/sclera: Conjunctivae normal.     Pupils: Pupils are equal, round, and reactive to light.  Neck:     Vascular: No carotid bruit.  Cardiovascular:     Rate and Rhythm: Normal rate and regular rhythm.     Pulses: Normal pulses.     Heart sounds: Normal heart sounds. No murmur heard.    No friction rub. No gallop.  Pulmonary:     Effort: Pulmonary effort is normal. No respiratory distress.     Breath sounds: Normal breath sounds. No wheezing, rhonchi or rales.  Chest:     Chest wall: No tenderness.  Abdominal:     General: Bowel sounds are normal. There is no distension.     Palpations: Abdomen is soft. There is no mass.     Tenderness: There is abdominal tenderness in the suprapubic area. There is no right CVA tenderness, left CVA tenderness, guarding or rebound.  Musculoskeletal:        General: No swelling or tenderness. Normal range of motion.  Cervical back: Normal range of motion. No rigidity or tenderness.     Right lower leg: No edema.     Left lower leg: No edema.  Lymphadenopathy:     Cervical: No cervical adenopathy.  Skin:    General: Skin is warm and dry.     Coloration: Skin is not pale.     Findings: No bruising, erythema, lesion or rash.  Neurological:     Mental Status: She is alert and oriented to person, place, and time.     Motor: No weakness.     Gait: Gait normal.  Psychiatric:        Mood and Affect: Mood normal.        Speech: Speech normal.        Behavior: Behavior normal.    Labs reviewed: No results for input(s): "NA", "K", "CL", "CO2", "GLUCOSE", "BUN", "CREATININE", "CALCIUM", "MG", "PHOS" in the last 8760 hours. No results for input(s): "AST", "ALT", "ALKPHOS", "BILITOT", "PROT", "ALBUMIN" in the last 8760 hours. No results for input(s): "WBC", "NEUTROABS", "HGB", "HCT", "MCV", "PLT" in the last 8760 hours. Lab Results  Component Value Date   TSH 1.760 08/01/2019   Lab Results  Component Value  Date   HGBA1C 5.7 09/18/2020   Lab Results  Component Value Date   CHOL 186 09/18/2020   HDL 58 09/18/2020   LDLCALC 107 09/18/2020   TRIG 115 09/18/2020   CHOLHDL 3.1 08/01/2019    Significant Diagnostic Results in last 30 days:  No results found.  Assessment/Plan  Frequent urination Afebrile Suprapubic tenderness on exam   - POC Urinalysis Dipstick indicates yellow clear urine positive for moderate blood, trace protein and moderate leukocytes but negative for nitrites.  Made aware we will send urine specimen for urine culture and she will take about 3 days.  Advised to notify provider if symptoms worsen.  - Urine Culture  Family/ staff Communication: Reviewed plan of care with patient verbalized understanding  Labs/tests ordered:  - Urine Culture - POC Urinalysis Dipstick  Next Appointment: Return if symptoms worsen or fail to improve.   Caesar Bookman, NP

## 2022-01-20 LAB — URINE CULTURE
MICRO NUMBER:: 13912907
SPECIMEN QUALITY:: ADEQUATE

## 2022-01-23 ENCOUNTER — Encounter: Payer: Self-pay | Admitting: Family

## 2022-01-25 ENCOUNTER — Telehealth: Payer: Self-pay | Admitting: *Deleted

## 2022-01-25 NOTE — Telephone Encounter (Signed)
Patient called and stated that she is having a Sore Throat and wants to see Dr. Sabra Heck.   Dr. Sabra Heck is not here today and his schedule if booked for tomorrow.   I offered patient an appointment for today with Monina and offered appointment for tomorrow with another provider and patient refused and stated that she only wants to see Dr. Sabra Heck.   I informed patient that if she changed her mind to call and we can get her in today and she stated that if is worsens she will call us back.

## 2022-04-19 ENCOUNTER — Telehealth: Payer: Self-pay

## 2022-04-19 NOTE — Telephone Encounter (Signed)
Transition Care Management Unsuccessful Follow-up Telephone Call  Date of discharge and from where:  04/16/2022, [facility]  Attempts:  1st Attempt  Reason for unsuccessful TCM follow-up call:  spoke with patient, she states that she does not need an appointment at this time. But requested a referral for a cardiologist which I sent message to her PCP.

## 2022-07-27 ENCOUNTER — Encounter: Payer: Self-pay | Admitting: Family Medicine

## 2022-07-27 ENCOUNTER — Ambulatory Visit: Payer: BC Managed Care – PPO | Admitting: Family Medicine

## 2022-07-27 VITALS — BP 127/88 | HR 61 | Temp 97.5°F | Resp 18 | Ht 65.0 in | Wt 226.0 lb

## 2022-07-27 DIAGNOSIS — J4520 Mild intermittent asthma, uncomplicated: Secondary | ICD-10-CM | POA: Diagnosis not present

## 2022-07-27 DIAGNOSIS — E1122 Type 2 diabetes mellitus with diabetic chronic kidney disease: Secondary | ICD-10-CM

## 2022-07-27 DIAGNOSIS — I10 Essential (primary) hypertension: Secondary | ICD-10-CM

## 2022-07-27 DIAGNOSIS — N181 Chronic kidney disease, stage 1: Secondary | ICD-10-CM

## 2022-07-27 DIAGNOSIS — I129 Hypertensive chronic kidney disease with stage 1 through stage 4 chronic kidney disease, or unspecified chronic kidney disease: Secondary | ICD-10-CM

## 2022-07-27 DIAGNOSIS — Z6839 Body mass index (BMI) 39.0-39.9, adult: Secondary | ICD-10-CM

## 2022-07-27 DIAGNOSIS — E1169 Type 2 diabetes mellitus with other specified complication: Secondary | ICD-10-CM | POA: Insufficient documentation

## 2022-07-27 DIAGNOSIS — F419 Anxiety disorder, unspecified: Secondary | ICD-10-CM

## 2022-07-27 DIAGNOSIS — E785 Hyperlipidemia, unspecified: Secondary | ICD-10-CM

## 2022-07-27 MED ORDER — ALPRAZOLAM 0.25 MG PO TABS: 0.2500 mg | ORAL_TABLET | Freq: Two times a day (BID) | ORAL | 0 refills | Status: DC | PRN

## 2022-07-27 NOTE — Progress Notes (Signed)
Provider:  Alain Honey, MD  Careteam: Patient Care Team: Wardell Honour, MD as PCP - General (Family Medicine)  PLACE OF SERVICE:  [place]  Advanced Directive information    Allergies  Allergen Reactions   Trelegy Ellipta [Fluticasone-Umeclidin-Vilant]     Blurred visit and irregular heart beat    Betadine [Povidone Iodine] Rash    Chief Complaint  Patient presents with   Acute Visit    Vibration in chest and torso   Quality Metric Gaps    Needs to Discuss colonoscopy, Hepatitis C Screening, Shingrix Vaccine, Mammogram, COVID 19 Vaccine, TDAP vaccine, & Flu Shot.       HPI: Patient is a 67 y.o. female .  Chief complaint today is feeling some vibration in her chest.  About a year ago she wore a rhythm monitor with negative findings.  There is no associated pain syncope or near syncope with the vibrations.  Not related to caffeine intake but there is increased anxiety related to the fact that she is unable to visit with her grandchildren due to her son's refusal to comply with that wish.  We spent some time teaching her how to assess her rhythm at the wrist of feeling her pulse.  Review of Systems:  Review of Systems  Constitutional: Negative.   Respiratory: Negative.    Cardiovascular:  Positive for palpitations.  Neurological: Negative.   Psychiatric/Behavioral:  The patient is nervous/anxious.   All other systems reviewed and are negative.   Past Medical History:  Diagnosis Date   Asthma    GERD (gastroesophageal reflux disease)    Hypertension    Past Surgical History:  Procedure Laterality Date   CESAREAN SECTION     Social History:   reports that she has never smoked. She has never used smokeless tobacco. She reports current alcohol use. She reports that she does not use drugs.  Family History  Problem Relation Age of Onset   Single kidney Mother    Bone cancer Father    Heart Problems Daughter    Chromosomal disorder Daughter      Medications: Patient's Medications  New Prescriptions   No medications on file  Previous Medications   ALBUTEROL (VENTOLIN HFA) 108 (90 BASE) MCG/ACT INHALER    Inhale 2 puffs into the lungs every 6 (six) hours as needed. wheezing   AMLODIPINE (NORVASC) 10 MG TABLET    Take 1 tablet (10 mg total) by mouth daily.   CHOLECALCIFEROL (VITAMIN D) 1000 UNITS TABLET    Take 1,000 Units by mouth daily.   COENZYME Q10 (CO Q 10) 100 MG CAPS    Take 100 mg by mouth daily.   KRILL OIL 500 MG CAPS    Take 500 mg by mouth daily.   MONTELUKAST (SINGULAIR) 10 MG TABLET    Take 1 tablet (10 mg total) by mouth at bedtime.   OMEGA-3 FATTY ACIDS (FISH OIL) 500 MG CAPS    Take 500 mg by mouth daily.   TURMERIC (QC TUMERIC COMPLEX PO)    Take 1 tablet by mouth daily.  Modified Medications   No medications on file  Discontinued Medications   No medications on file    Physical Exam:  Vitals:   07/27/22 1100  BP: 127/88  Pulse: 61  Resp: 18  Temp: (!) 97.5 F (36.4 C)  SpO2: 98%  Weight: 226 lb (102.5 kg)  Height: 5\' 5"  (1.651 m)   Body mass index is 37.61 kg/m. Wt Readings from  Last 3 Encounters:  07/27/22 226 lb (102.5 kg)  01/19/22 220 lb 3.2 oz (99.9 kg)  08/24/21 230 lb 9.6 oz (104.6 kg)    Physical Exam Vitals and nursing note reviewed.  Constitutional:      Appearance: Normal appearance.  HENT:     Mouth/Throat:     Mouth: Mucous membranes are moist.     Pharynx: Oropharynx is clear.  Eyes:     Extraocular Movements: Extraocular movements intact.     Conjunctiva/sclera: Conjunctivae normal.  Cardiovascular:     Rate and Rhythm: Normal rate and regular rhythm.  Pulmonary:     Effort: Pulmonary effort is normal.     Breath sounds: Normal breath sounds.  Abdominal:     General: Bowel sounds are normal.     Palpations: Abdomen is soft.  Musculoskeletal:        General: Normal range of motion.  Skin:    General: Skin is warm and dry.  Neurological:     General: No focal  deficit present.     Mental Status: She is alert and oriented to person, place, and time.  Psychiatric:        Mood and Affect: Mood normal.        Behavior: Behavior normal.     Labs reviewed: Basic Metabolic Panel: No results for input(s): "NA", "K", "CL", "CO2", "GLUCOSE", "BUN", "CREATININE", "CALCIUM", "MG", "PHOS", "TSH" in the last 8760 hours. Liver Function Tests: No results for input(s): "AST", "ALT", "ALKPHOS", "BILITOT", "PROT", "ALBUMIN" in the last 8760 hours. No results for input(s): "LIPASE", "AMYLASE" in the last 8760 hours. No results for input(s): "AMMONIA" in the last 8760 hours. CBC: No results for input(s): "WBC", "NEUTROABS", "HGB", "HCT", "MCV", "PLT" in the last 8760 hours. Lipid Panel: No results for input(s): "CHOL", "HDL", "LDLCALC", "TRIG", "CHOLHDL", "LDLDIRECT" in the last 8760 hours. TSH: No results for input(s): "TSH" in the last 8760 hours. A1C: Lab Results  Component Value Date   HGBA1C 5.7 09/18/2020     Assessment/Plan  1. Anxiety No complaints today but has had hard time with some partial urine retention and voiding small amounts.  Was checked yesterday by [facility] at [facility] and started on Flomax  2. Mild intermittent asthma without complication Doing well no need for inhalers  3. Class 2 severe obesity due to excess calories with serious comorbidity and body mass index (BMI) of 39.0 to 39.9 in adult [facility]) Patient has lost about 4 pounds since his last visit and to continue to restrict carbs  4. Hypertension, unspecified type Blood pressure again at 128/78.  Continue same meds  5. 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 Hyperlipidemia associated with type 2 diabetes mellitus ([place]) Lipids also at goal with LDL of [place], MD [place] [phone]

## 2022-08-31 ENCOUNTER — Encounter: Payer: BC Managed Care – PPO | Admitting: Adult Health

## 2022-09-01 NOTE — Progress Notes (Signed)
This encounter was created in error - please disregard.

## 2022-09-03 ENCOUNTER — Other Ambulatory Visit: Payer: Self-pay | Admitting: Family Medicine

## 2022-09-03 DIAGNOSIS — I10 Essential (primary) hypertension: Secondary | ICD-10-CM

## 2023-02-17 IMAGING — CR DG CHEST 2V
2 series · 2 of 2 positions shown · non-contrast
Comparison: January 21, 2010

CLINICAL DATA: not well controlled asthma

EXAM:
CHEST - 2 VIEW

[w chest pa]
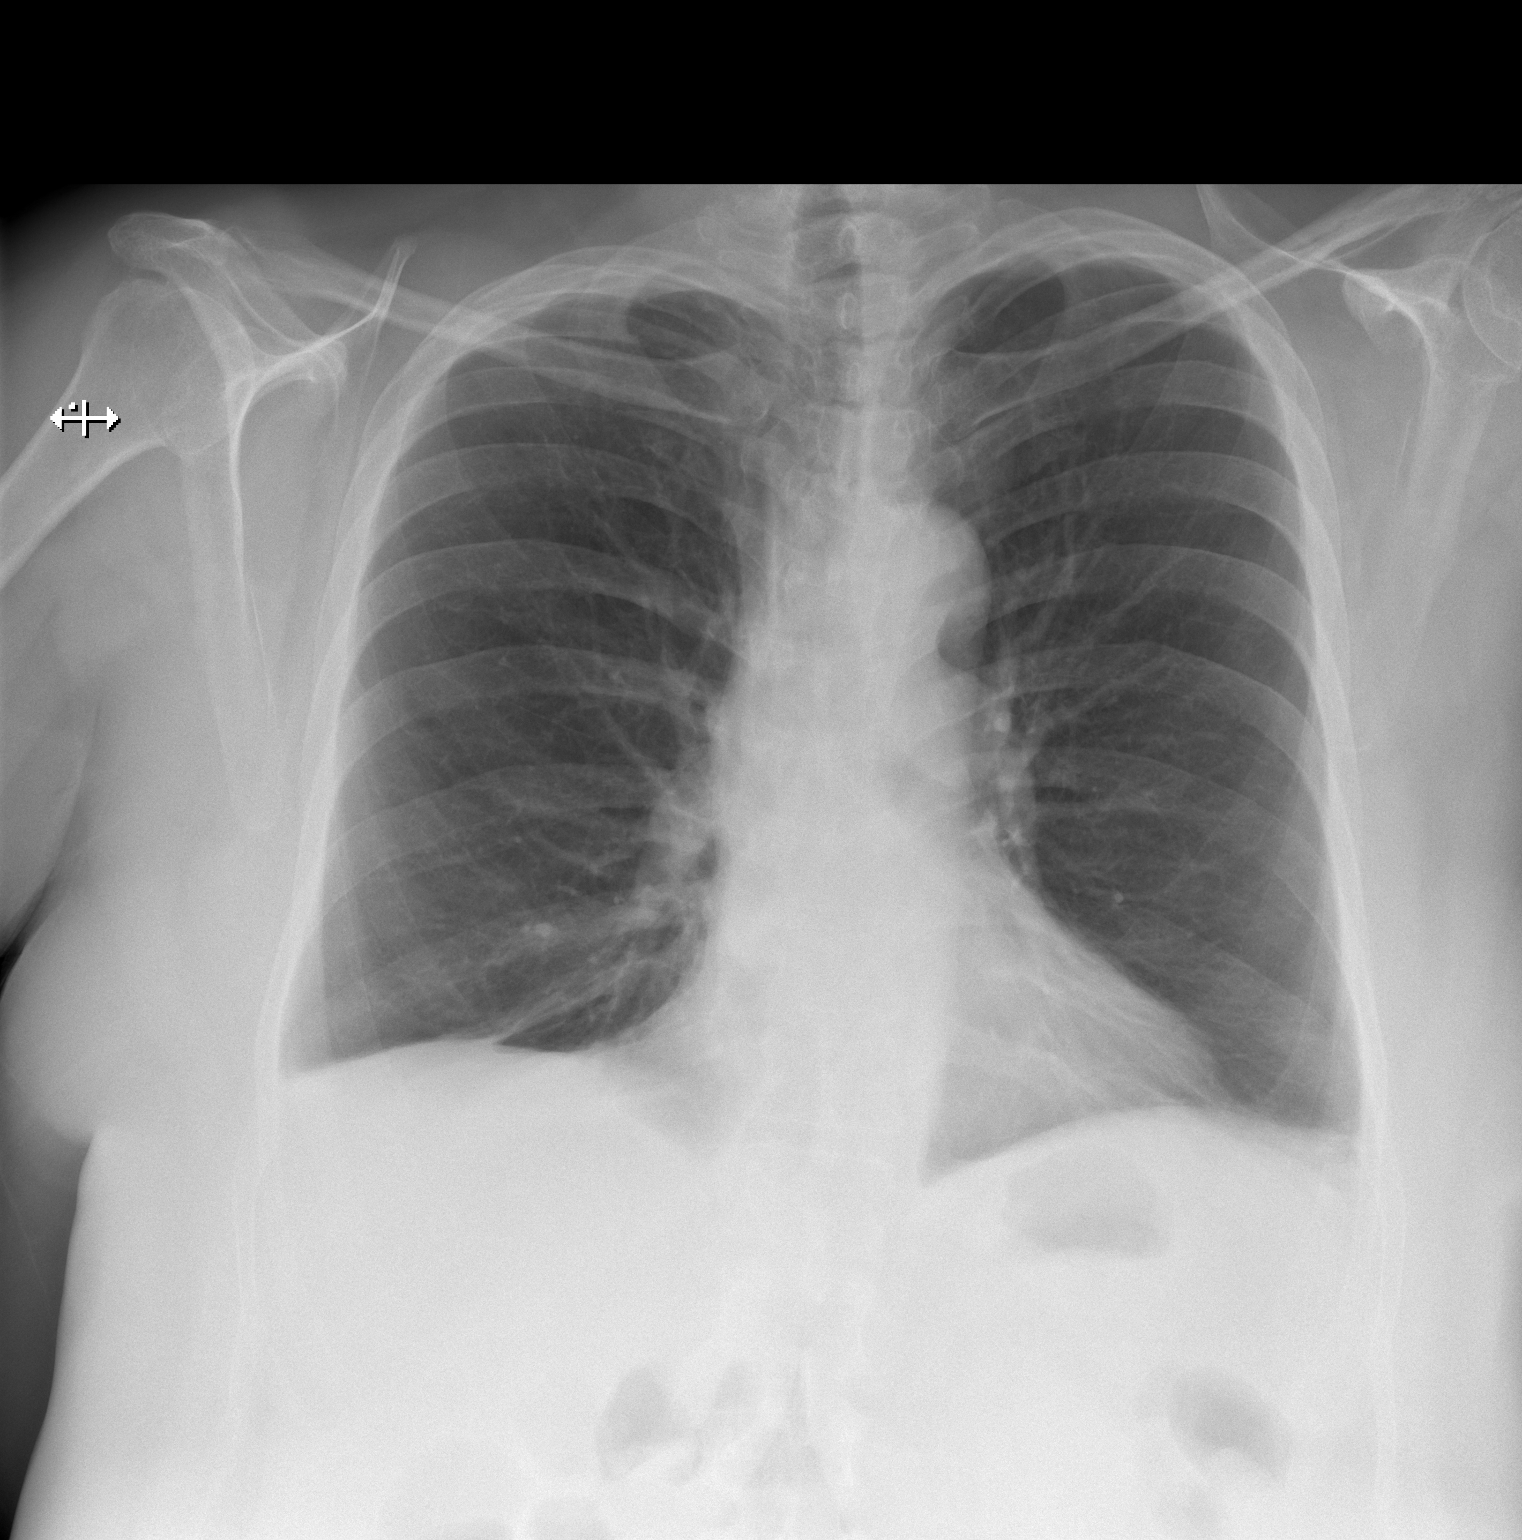

[w chest lat]
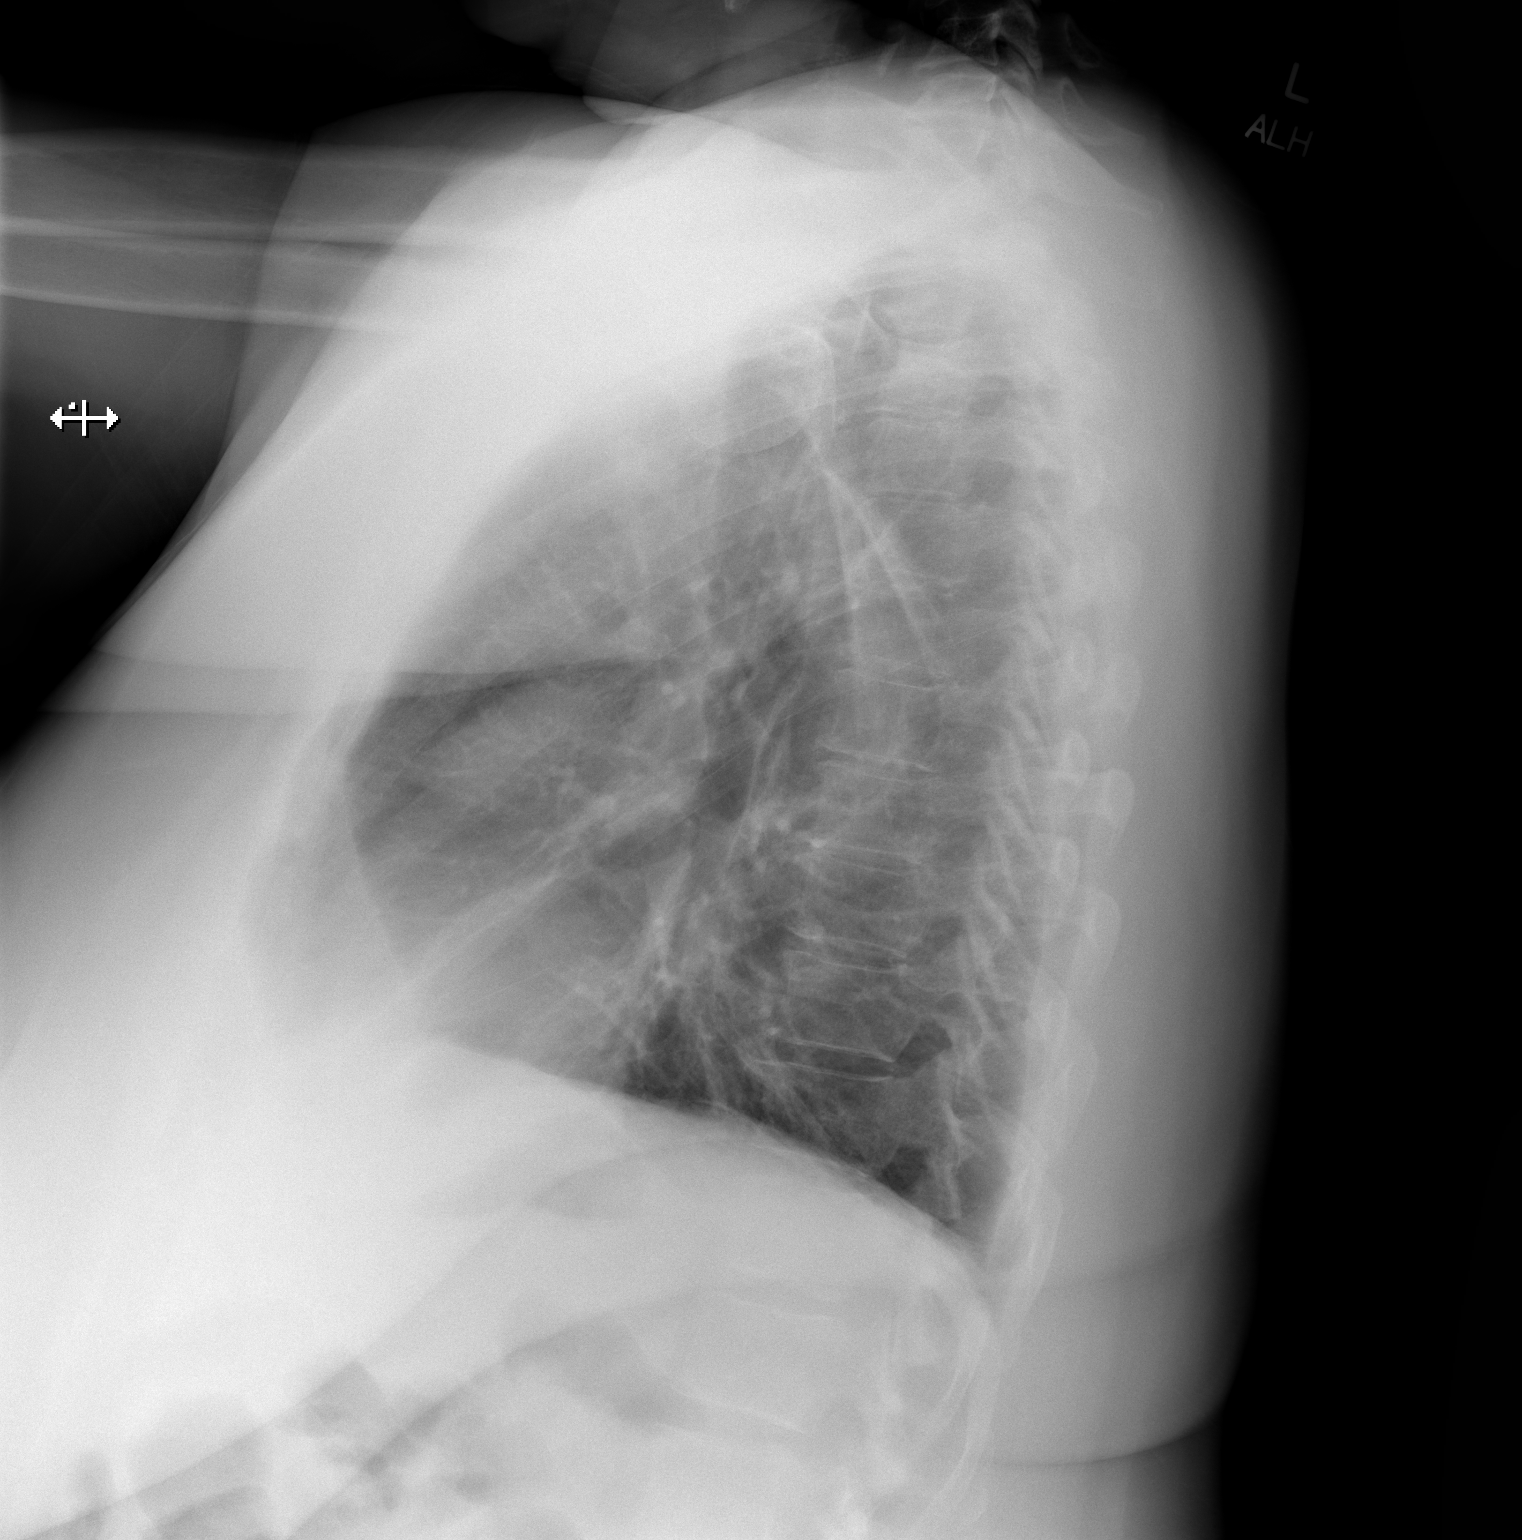

[2 of 2 positions shown; findings below may reference images not displayed]

FINDINGS: Unchanged cardiomediastinal silhouette. Unchanged blunting of the
costophrenic angles bilaterally with likely bibasilar atelectasis.
No visible pneumothorax. No new airspace disease. No acute osseous
abnormality.
IMPRESSION: Bibasilar atelectasis with small effusions/pleural thickening.

## 2023-03-15 DIAGNOSIS — Z01419 Encounter for gynecological examination (general) (routine) without abnormal findings: Secondary | ICD-10-CM | POA: Diagnosis not present

## 2023-04-14 ENCOUNTER — Other Ambulatory Visit: Payer: Self-pay | Admitting: Family Medicine

## 2023-04-14 DIAGNOSIS — I10 Essential (primary) hypertension: Secondary | ICD-10-CM

## 2023-04-25 DIAGNOSIS — H5203 Hypermetropia, bilateral: Secondary | ICD-10-CM | POA: Diagnosis not present

## 2023-04-25 DIAGNOSIS — H524 Presbyopia: Secondary | ICD-10-CM | POA: Diagnosis not present

## 2023-04-25 DIAGNOSIS — H52209 Unspecified astigmatism, unspecified eye: Secondary | ICD-10-CM | POA: Diagnosis not present

## 2023-05-23 ENCOUNTER — Encounter: Payer: Self-pay | Admitting: Sports Medicine

## 2023-05-23 ENCOUNTER — Ambulatory Visit (INDEPENDENT_AMBULATORY_CARE_PROVIDER_SITE_OTHER): Payer: No Typology Code available for payment source | Admitting: Sports Medicine

## 2023-05-23 VITALS — BP 126/84 | HR 80 | Temp 96.0°F | Resp 17 | Ht 65.0 in | Wt 233.2 lb

## 2023-05-23 DIAGNOSIS — I10 Essential (primary) hypertension: Secondary | ICD-10-CM

## 2023-05-23 DIAGNOSIS — Z6839 Body mass index (BMI) 39.0-39.9, adult: Secondary | ICD-10-CM

## 2023-05-23 DIAGNOSIS — E1169 Type 2 diabetes mellitus with other specified complication: Secondary | ICD-10-CM

## 2023-05-23 DIAGNOSIS — J4520 Mild intermittent asthma, uncomplicated: Secondary | ICD-10-CM

## 2023-05-23 DIAGNOSIS — R7989 Other specified abnormal findings of blood chemistry: Secondary | ICD-10-CM | POA: Diagnosis not present

## 2023-05-23 DIAGNOSIS — E785 Hyperlipidemia, unspecified: Secondary | ICD-10-CM | POA: Diagnosis not present

## 2023-05-23 DIAGNOSIS — Z780 Asymptomatic menopausal state: Secondary | ICD-10-CM

## 2023-05-23 DIAGNOSIS — E66812 Obesity, class 2: Secondary | ICD-10-CM

## 2023-05-23 DIAGNOSIS — K219 Gastro-esophageal reflux disease without esophagitis: Secondary | ICD-10-CM

## 2023-05-23 DIAGNOSIS — Z131 Encounter for screening for diabetes mellitus: Secondary | ICD-10-CM

## 2023-05-23 MED ORDER — AMLODIPINE BESYLATE 10 MG PO TABS: 10.0000 mg | ORAL_TABLET | Freq: Every day | ORAL | 3 refills | Status: DC

## 2023-05-23 MED ORDER — MONTELUKAST SODIUM 10 MG PO TABS: 10.0000 mg | ORAL_TABLET | Freq: Every day | ORAL | 3 refills | Status: DC

## 2023-05-23 NOTE — Progress Notes (Addendum)
 Careteam: Patient Care Team: Sherlynn Madden, MD as PCP - General (Internal Medicine)  PLACE OF SERVICE:  [facility]  Advanced Directive information Does Patient Have a Medical Advance Directive?: Yes, Type of Advance Directive: Healthcare Power of Attorney, Does patient want to make changes to medical advance directive?: No - Patient declined  Allergies  Allergen Reactions   Trelegy Ellipta  [Fluticasone -Umeclidin-Vilant]     Blurred visit and irregular heart beat    Betadine [Povidone Iodine] Rash    Chief Complaint  Patient presents with   Medical Management of Chronic Issues    Routine visit.    Health Maintenance    Discuss the need for Foot exam, Eye exam, Pap smear, Mammogram, and Dexa scan.   Immunizations    Discuss the need for Pne vaccine, Shingrix vaccine, Influenza vaccine, and Covid vaccine.     Discussed the use of AI scribe software for clinical note transcription with the patient, who gave verbal consent to proceed.  History of Present Illness   The patient, with a history of hypertension and asthma, presents for a routine follow-up after approximately one year. She reports overall good health with no urgent care or emergency room visits in the past year. The patient acknowledges the need for weight loss but does not report any specific symptoms or difficulties related to this.  The patient is currently on amlodipine  10mg  for hypertension and reports no associated side effects such as dizziness or lightheadedness. She also reports engaging in light exercise and plans to increase the frequency.  Regarding her asthma, the patient has an emergency inhaler but has not needed to use it in over a year, suggesting good control of her asthma. She also takes Singulair  at night.  The patient denies any chest pain, palpitations, abdominal pain, nausea, vomiting, dysuria, hematuria, or blood in the stool. She also denies any recent falls or balance issues. Mood is  reported as generally good with occasional worry but no depression or loss of interest.  The patient's medications include magnesium supplements, vitamin D  supplements, Coenzyme Q10, and albuterol  as needed. She denies any use of Xanax .       Review of Systems:  Review of Systems  Constitutional:  Negative for chills and fever.  HENT:  Negative for congestion and sore throat.   Eyes:  Negative for double vision.  Respiratory:  Negative for cough, sputum production and shortness of breath.   Cardiovascular:  Negative for chest pain, palpitations and leg swelling.  Gastrointestinal:  Negative for abdominal pain, heartburn and nausea.  Genitourinary:  Negative for dysuria, frequency and hematuria.  Musculoskeletal:  Negative for falls and myalgias.  Neurological:  Negative for dizziness, sensory change and focal weakness.   Negative unless indicated in HPI.   Past Medical History:  Diagnosis Date   Asthma    GERD (gastroesophageal reflux disease)    Hypertension    Past Surgical History:  Procedure Laterality Date   CESAREAN SECTION     Social History:   reports that she has never smoked. She has never used smokeless tobacco. She reports current alcohol use. She reports that she does not use drugs.  Family History  Problem Relation Age of Onset   Single kidney Mother    Bone cancer Father    Heart Problems Daughter    Chromosomal disorder Daughter     Medications: Patient's Medications  New Prescriptions   No medications on file  Previous Medications   ALBUTEROL  (VENTOLIN  HFA) 108 (90 BASE) MCG/ACT  INHALER    Inhale 2 puffs into the lungs every 6 (six) hours as needed. wheezing   AMLODIPINE  (NORVASC ) 10 MG TABLET    Take 1 tablet by mouth once daily   CHOLECALCIFEROL (VITAMIN D ) 1000 UNITS TABLET    Take 1,000 Units by mouth daily.   COENZYME Q10 (CO Q 10) 100 MG CAPS    Take 100 mg by mouth daily.   KRILL OIL 500 MG CAPS    Take 500 mg by mouth daily.   MONTELUKAST   (SINGULAIR ) 10 MG TABLET    TAKE 1 TABLET BY MOUTH AT BEDTIME   OMEGA-3 FATTY ACIDS (FISH OIL) 500 MG CAPS    Take 500 mg by mouth daily.   TURMERIC (QC TUMERIC COMPLEX PO)    Take 1 tablet by mouth daily.  Modified Medications   No medications on file  Discontinued Medications   ALPRAZOLAM  (XANAX ) 0.25 MG TABLET    Take 1 tablet (0.25 mg total) by mouth 2 (two) times daily as needed for anxiety.    Physical Exam: Vitals:   05/23/23 0924  BP: 126/84  Pulse: 80  Resp: 17  Temp: (!) 96 F (35.6 C)  SpO2: 96%  Weight: 233 lb 3.2 oz (105.8 kg)  Height: 5' 5 (1.651 m)   Body mass index is 38.81 kg/m. BP Readings from Last 3 Encounters:  05/23/23 126/84  07/27/22 127/88  01/19/22 110/68   Wt Readings from Last 3 Encounters:  05/23/23 233 lb 3.2 oz (105.8 kg)  07/27/22 226 lb (102.5 kg)  01/19/22 220 lb 3.2 oz (99.9 kg)    Physical Exam Constitutional:      Appearance: Normal appearance.  HENT:     Head: Normocephalic and atraumatic.  Cardiovascular:     Rate and Rhythm: Normal rate and regular rhythm.     Pulses: Normal pulses.     Heart sounds: Normal heart sounds.  Pulmonary:     Effort: No respiratory distress.     Breath sounds: No stridor. No wheezing or rales.  Abdominal:     General: Bowel sounds are normal. There is no distension.     Palpations: Abdomen is soft.     Tenderness: There is no abdominal tenderness. There is no right CVA tenderness or guarding.  Musculoskeletal:        General: No swelling.  Neurological:     Mental Status: She is alert. Mental status is at baseline.     Sensory: No sensory deficit.     Motor: No weakness.     Labs reviewed: Basic Metabolic Panel: No results for input(s): NA, K, CL, CO2, GLUCOSE, BUN, CREATININE, CALCIUM , MG, PHOS, TSH in the last 8760 hours. Liver Function Tests: No results for input(s): AST, ALT, ALKPHOS, BILITOT, PROT, ALBUMIN in the last 8760 hours. No results for  input(s): LIPASE, AMYLASE in the last 8760 hours. No results for input(s): AMMONIA in the last 8760 hours. CBC: No results for input(s): WBC, NEUTROABS, HGB, HCT, MCV, PLT in the last 8760 hours. Lipid Panel: No results for input(s): CHOL, HDL, LDLCALC, TRIG, CHOLHDL, LDLDIRECT in the last 8760 hours. TSH: No results for input(s): TSH in the last 8760 hours. A1C: Lab Results  Component Value Date   HGBA1C 5.7 09/18/2020     Assessment/Plan   Hypertension Well controlled on Amlodipine  10mg  daily. No symptoms of hypotension. -Continue Amlodipine  10mg  daily. - amLODipine  (NORVASC ) 10 MG tablet; Take 1 tablet (10 mg total) by mouth daily.  Dispense: 90 tablet; Refill:  3 - Basic Metabolic Panel with eGFR  Mild intermittent asthma without complication ([place])   - montelukast  (SINGULAIR ) 10 MG tablet; Take 1 tablet (10 mg total) by mouth at bedtime.  Dispense: 90 tablet; Refill: 3 - CBC (no diff) Well controlled, no recent use of rescue inhaler. On Singulair  nightly. -Continue Singulair  nightly.  Class 2 severe obesity due to excess calories with serious comorbidity and body mass index (BMI) of 39.0 to 39.9 in adult [facility])   - Lipid Panel - Hemoglobin A1C Patient acknowledges need for weight loss. Discussed increasing physical activity. -Increase physical activity to 4-5 times per week.  Skin Rash Patient reports occasional rashes in response to certain foods. No systemic symptoms. -Use hydrocortisone cream as needed for rashes.  General Health Maintenance -Order comprehensive blood work including magnesium and vitamin D  levels. -Refill Singulair  and Amlodipine  prescriptions. -Order bone density test. -Schedule follow-up appointment in 3 months   - amLODipine  (NORVASC ) 10 MG tablet; Take 1 tablet (10 mg total) by mouth daily.  Dispense: 90 tablet; Refill: 3 - Basic Metabolic Panel with eGFR     Hyperlipidemia associated with type 2 diabetes  mellitus (HCC)  Will check lipid panel   Gastroesophageal reflux disease without esophagitis  Avoid spicy foods - CBC (no diff)    Postmenopausal estrogen deficiency   - DG Bone Density; Future  Low vitamin D  level   - Vitamin D , 25-hydroxy   Hypomagnesemia   - Magnesium   Screening for diabetes mellitus   - Hemoglobin A1C  No follow-ups on file.:   Egon Dittus

## 2023-05-24 ENCOUNTER — Other Ambulatory Visit: Payer: Self-pay | Admitting: Sports Medicine

## 2023-05-24 LAB — BASIC METABOLIC PANEL WITH GFR
BUN: 17 mg/dL (ref 7–25)
CO2: 28 mmol/L (ref 20–32)
Calcium: 9.5 mg/dL (ref 8.6–10.4)
Chloride: 106 mmol/L (ref 98–110)
Creat: 0.96 mg/dL (ref 0.50–1.05)
Glucose, Bld: 92 mg/dL (ref 65–99)
Potassium: 4 mmol/L (ref 3.5–5.3)
Sodium: 143 mmol/L (ref 135–146)
eGFR: 66 mL/min/{1.73_m2} (ref >=60)

## 2023-05-24 LAB — CBC
HCT: 40.4 % (ref 35.0–45.0)
Hemoglobin: 13.6 g/dL (ref 11.7–15.5)
MCH: 30.4 pg (ref 27.0–33.0)
MCHC: 33.7 g/dL (ref 32.0–36.0)
MCV: 90.2 fL (ref 80.0–100.0)
MPV: 9.6 fL (ref 7.5–12.5)
Platelets: 321 10*3/uL (ref 140–400)
RBC: 4.48 10*6/uL (ref 3.80–5.10)
RDW: 12.5 % (ref 11.0–15.0)
WBC: 6.4 10*3/uL (ref 3.8–10.8)

## 2023-05-24 LAB — LIPID PANEL
Cholesterol: 190 mg/dL (ref <200)
HDL: 60 mg/dL (ref >=50)
LDL Cholesterol (Calc): 110 mg/dL — ABNORMAL HIGH
Non-HDL Cholesterol (Calc): 130 mg/dL — ABNORMAL HIGH (ref <130)
Total CHOL/HDL Ratio: 3.2 (calc) (ref <5.0)
Triglycerides: 102 mg/dL (ref <150)

## 2023-05-24 LAB — HEMOGLOBIN A1C
Hgb A1c MFr Bld: 5.8 %{Hb} — ABNORMAL HIGH (ref <5.7)
Mean Plasma Glucose: 120 mg/dL
eAG (mmol/L): 6.6 mmol/L

## 2023-05-24 LAB — MAGNESIUM: Magnesium: 2.1 mg/dL (ref 1.5–2.5)

## 2023-05-24 LAB — VITAMIN D 25 HYDROXY (VIT D DEFICIENCY, FRACTURES): Vit D, 25-Hydroxy: 116 ng/mL — ABNORMAL HIGH (ref 30–100)

## 2023-05-24 MED ORDER — ATORVASTATIN CALCIUM 20 MG PO TABS: 20.0000 mg | ORAL_TABLET | Freq: Every day | ORAL | 3 refills | Status: DC

## 2023-05-25 ENCOUNTER — Telehealth: Payer: Self-pay | Admitting: Sports Medicine

## 2023-05-25 NOTE — Telephone Encounter (Signed)
Patient was notified of the results

## 2023-11-08 ENCOUNTER — Ambulatory Visit (INDEPENDENT_AMBULATORY_CARE_PROVIDER_SITE_OTHER): Admitting: Family Medicine

## 2023-11-08 ENCOUNTER — Encounter: Payer: Self-pay | Admitting: Family Medicine

## 2023-11-08 VITALS — BP 119/80 | HR 71 | Ht 65.0 in | Wt 234.8 lb

## 2023-11-08 DIAGNOSIS — E782 Mixed hyperlipidemia: Secondary | ICD-10-CM

## 2023-11-08 DIAGNOSIS — Z1211 Encounter for screening for malignant neoplasm of colon: Secondary | ICD-10-CM | POA: Diagnosis not present

## 2023-11-08 DIAGNOSIS — Z6839 Body mass index (BMI) 39.0-39.9, adult: Secondary | ICD-10-CM

## 2023-11-08 DIAGNOSIS — I10 Essential (primary) hypertension: Secondary | ICD-10-CM | POA: Diagnosis not present

## 2023-11-08 DIAGNOSIS — E66812 Obesity, class 2: Secondary | ICD-10-CM | POA: Diagnosis not present

## 2023-11-08 DIAGNOSIS — J4520 Mild intermittent asthma, uncomplicated: Secondary | ICD-10-CM

## 2023-11-08 DIAGNOSIS — E559 Vitamin D deficiency, unspecified: Secondary | ICD-10-CM

## 2023-11-08 DIAGNOSIS — Z0000 Encounter for general adult medical examination without abnormal findings: Secondary | ICD-10-CM

## 2023-11-08 DIAGNOSIS — Z1212 Encounter for screening for malignant neoplasm of rectum: Secondary | ICD-10-CM | POA: Diagnosis not present

## 2023-11-08 DIAGNOSIS — Z7689 Persons encountering health services in other specified circumstances: Secondary | ICD-10-CM

## 2023-11-08 NOTE — Patient Instructions (Addendum)
 It was nice to see you today,  We addressed the following topics today: - I am sending in your cologuard.  Someone will call you and then it will be mailed to you  - there is a prescription mouthwash called chlorhexidine. If you want to try that let me know and I will send in a prescription.  - call your insurance company and ask them what medications they cover for weight loss, including wegovy and zepbound, but also contrave and phentermine.  Once you know what medications they cover we can discuss if they are right for you.     Have a great day,  Rolan Slain, MD

## 2023-11-08 NOTE — Progress Notes (Signed)
 New Patient Office Visit  Subjective   Patient ID: Lori Newton, female    DOB: 21-Sep-1955  Age: 68 y.o. MRN: [id number]  CC:  Chief Complaint  Patient presents with   New Patient (Initial Visit)    HPI Lori Newton presents to establish care  Subjective - Establishing care after previous provider retired - Reports mild high blood pressure, asthma, cholesterol, acid reflux - Food allergies since childhood: chocolate, tomatoes, oranges, grapefruit, peaches cause rash on arms, forehead, hands, ankles the next day after consumption. No difficulty breathing or swelling, just rash. Uses [employer] my eczema product for relief - Asthma mild, uses albuterol  inhaler as needed, last used over 2 months ago - Bad breath despite good oral hygiene including tongue scraping, brushing, flossing. Dentist ruled out throat stones - Dry mouth - Wants to lose 70 pounds, knows diet and exercise needed - Vitamin D  helps prevent asthma attacks, previous provider wanted complete cessation due to high levels  Medications: amlodipine  (blood pressure), montelukast , fish oil, albuterol  inhaler PRN, vitamin D  (reduced from 10,000 to 5,000 units after high levels)  PMH: high blood pressure, mild asthma, mild cholesterol, acid reflux, food allergies. PSH: C-section only. FH: maternal grandmother stroke early 20s, maternal family history high blood pressure, father bone cancer at 81. 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 wheezing   amLODipine  (NORVASC ) 10 MG tablet Take 1 tablet (10 mg total) by mouth daily.   Coenzyme Q10 (CO Q 10) 100 MG CAPS Take 100 mg by mouth daily.   montelukast  (SINGULAIR ) 10 MG tablet Take 1 tablet (10 mg total) by mouth at bedtime.   Omega-3 Fatty Acids (FISH OIL) 500 MG CAPS Take 500 mg by mouth daily.   Krill Oil 500 MG CAPS Take 500 mg by mouth daily. (Patient not taking: Reported on 11/08/2023)   [DISCONTINUED] atorvastatin  (LIPITOR) 20 MG tablet Take 1 tablet (20 mg total) by mouth daily.   [DISCONTINUED] Turmeric (QC TUMERIC COMPLEX PO) Take 1 tablet by mouth daily.   No facility-administered encounter medications on file as of 11/08/2023.    Past Medical History:  Diagnosis Date   Asthma    GERD (gastroesophageal reflux disease)    Hypertension     Past Surgical History:  Procedure Laterality Date   CESAREAN SECTION      Family History  Problem Relation Age of Onset   Single kidney Mother    Bone cancer Father    Heart Problems Daughter    Chromosomal disorder Daughter     Social History   Socioeconomic History   Marital status: Married    Spouse name: Not on file   Number of children: 7   Years of education: Not on file   Highest education level: Not on file  Occupational History   Not on file  Tobacco Use   Smoking status: Never   Smokeless tobacco: Never  Vaping Use   Vaping status: Never Used  Substance and Sexual Activity   Alcohol use: Yes  Comment: rarely glass of wine   Drug use: No   Sexual activity: Not on file  Other Topics Concern   Not on file  Social History Narrative   Diet: Keto, fast food and others      Caffeine: sometimes      Married, if yes what year: yes, 1978      Do you live in a house, apartment, assisted living, condo, trailer, ect: House      Is it one or more stories: 2      How many persons live in your home? 6      Pets: none      Highest level or education  completed: 12th       Current/Past profession: bus driver      Exercise:  sometimes                Type and how often: hula hoop 3 times a week         Living Will: No   DNR: No   POA/HPOA: No      Functional Status:   Do you have difficulty bathing or dressing yourself? No   Do you have difficulty preparing food or eating? No   Do you have difficulty managing your medications? No   Do you have difficulty managing your finances? No   Do you have difficulty affording your medications? No   Social Drivers of Corporate investment banker Strain: Low Risk  (11/08/2023)   Overall Financial Resource Strain (CARDIA)    Difficulty of Paying Living Expenses: Not hard at all  Food Insecurity: No Food Insecurity (11/08/2023)   Hunger Vital Sign    Worried About Running Out of Food in the Last Year: Never true    Ran Out of Food in the Last Year: Never true  Transportation Needs: No Transportation Needs (11/08/2023)   PRAPARE - Administrator, Civil Service (Medical): No    Lack of Transportation (Non-Medical): No  Physical Activity: Insufficiently Active (11/08/2023)   Exercise Vital Sign    Days of Exercise per Week: 3 days    Minutes of Exercise per Session: 30 min  Stress: Stress Concern Present (11/08/2023)   [employer] of Occupational Health - Occupational Stress Questionnaire    Feeling of Stress: To some extent  Social Connections: Socially Integrated (11/08/2023)   Social Connection and Isolation Panel    Frequency of Communication with Friends and Family: Three times a week    Frequency of Social Gatherings with Friends and Family: Three times a week    Attends Religious Services: More than 4 times per year    Active Member of Clubs or Organizations: Yes    Attends Banker Meetings: More than 4 times per year    Marital Status: Married  Catering manager Violence: Not At Risk (11/08/2023)   Humiliation, Afraid, Rape, and Kick questionnaire    Fear of  Current or Ex-Partner: No    Emotionally Abused: No    Physically Abused: No    Sexually Abused: No    ROS     Objective   BP 119/80   Pulse 71   Ht 5' 5 (1.651 m)   Wt 234 lb 12.8 oz (106.5 kg)   SpO2 95%   BMI 39.07 kg/m   Physical Exam Gen: alert, oriented Heent: perrla, eomi Cv: rrr Pulm: lctab Gi: soft, nbs Msk: equal b/l Psych: pleasant affect     Assessment & Plan:  Encounter to establish care  Mild intermittent asthma without complication Assessment & Plan:  mild, well-controlled - Continue albuterol  PRN - Vitamin D  appears helpful for symptom control   Hypertension, unspecified type Assessment & Plan:  stable - Continue amlodipine   Orders: -     Comprehensive metabolic panel with GFR; Future  Class 2 severe obesity due to excess calories with serious comorbidity and body mass index (BMI) of 39.0 to 39.9 in adult [facility]) Assessment & Plan: desires 70-pound weight loss - Check insurance coverage for weight loss medications - Referral to Healthy Weight and Wellness program option - Goal 1-2 pounds per week    Healthcare maintenance Assessment & Plan: due for Cologuard test, declines colonoscopy and mammography, Pap smear scheduled - Order Cologuard - Labs before next visit   Encounter for colorectal cancer screening -     Cologuard  Vitamin D  deficiency -     VITAMIN D  25 Hydroxy (Vit-D Deficiency, Fractures); Future  Mixed hyperlipidemia -     Lipid panel; Future -     Comprehensive metabolic panel with GFR; Future  Halitosis Dentist ruled out tonsil stones. - Continue current oral hygiene twice daily - Floss between meals - Consider non-alcohol mouthwash - Tongue scraper - Sugar-free gum to stimulate saliva - Over-the-counter dry mouth products - Chlorhexidine mouthwash option discussed  Return in about 6 months (around 05/13/2024) for physical.   Toribio MARLA Slain, MD

## 2023-11-10 DIAGNOSIS — Z0000 Encounter for general adult medical examination without abnormal findings: Secondary | ICD-10-CM | POA: Insufficient documentation

## 2023-11-10 NOTE — Assessment & Plan Note (Signed)
 desires 70-pound weight loss - Check insurance coverage for weight loss medications - Referral to Healthy Weight and Wellness program option - Goal 1-2 pounds per week

## 2023-11-10 NOTE — Assessment & Plan Note (Signed)
 mild, well-controlled - Continue albuterol  PRN - Vitamin D  appears helpful for symptom control

## 2023-11-10 NOTE — Assessment & Plan Note (Signed)
-   stable  - Continue amlodipine

## 2023-11-10 NOTE — Assessment & Plan Note (Signed)
 due for Cologuard test, declines colonoscopy and mammography, Pap smear scheduled - Order Cologuard - Labs before next visit

## 2024-02-02 ENCOUNTER — Encounter: Payer: Self-pay | Admitting: Family Medicine

## 2024-02-02 ENCOUNTER — Ambulatory Visit (INDEPENDENT_AMBULATORY_CARE_PROVIDER_SITE_OTHER): Admitting: Family Medicine

## 2024-02-02 VITALS — BP 133/86 | HR 65 | Ht 65.0 in | Wt 231.1 lb

## 2024-02-02 DIAGNOSIS — H04203 Unspecified epiphora, bilateral lacrimal glands: Secondary | ICD-10-CM | POA: Diagnosis not present

## 2024-02-02 NOTE — Progress Notes (Signed)
 Acute Office Visit  Subjective:     Patient ID: Lori Newton, female    DOB: 1955-12-03, 68 y.o.   MRN: [id number]  Chief Complaint  Patient presents with   Eye Problem    HPI Patient is in today for   Subjective - Right eye pain, intermittent for ~1 month. Not daily. Described as pain under the eye and around it. Difficulty opening the eye and refocusing after waking. Associated with tearing. No vision changes like blurriness or double vision, but reports some difficulty focusing at a distance, even with glasses. Denies nausea, vomiting, or headaches. Denies foreign body or trauma. - Reports a history of allergies. Had an episode of eye redness and itching a couple of weeks ago, which was relieved with Visine drops. Also had a unilateral rash on the right side of the face, including the eyelid, which lasted a couple of weeks. Rash was pruritic but not painful. - Reports allergies to acidic foods (oranges, lemons, grapefruit) and chocolate, causing a skin rash. - Reports occasional productive cough, but no sneezing or sinus congestion. - Used over-the-counter liver cleanse pills from [employer] and reports passing a worm in the stool. - Last eye exam for glasses was ~4 months ago, with normal eye pressures.  Medications: Over-the-counter Visine drops for redness used a few weeks ago. Used castor oil topically on the eyelid. Took an unknown liver cleanse supplement from [employer]. Does not take any prescription medications, antihistamines, or nasal sprays.  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 Dry eyes. PSH: None mentioned. FH: Uncles with cataracts. No known family history of glaucoma. Social Hx: Scientist, research (physical sciences).  ROS: Constitutional: Denies fever, chills. HEENT: Positive for eye pain, tearing, and intermittent redness. Reports difficulty focusing. Denies blurry or double vision. Denies sinus congestion. Positive for occasional productive  cough. Denies sneezing. Skin: History of a pruritic rash on the right side of the face. GI: Denies nausea or vomiting. Reports passing a worm after a liver cleanse. Neurologic: Denies headaches.   ROS      Objective:    BP 133/86   Pulse 65   Ht 5' 5 (1.651 m)   Wt 231 lb 1.9 oz (104.8 kg)   SpO2 95%   BMI 38.46 kg/m    Physical Exam HEENT: Conjunctivae are non-injected bilaterally. Extraocular movements are intact. No pain with palpation of the globe or periorbital area. Pupils are equal, round, and reactive to light. Oroscopic exam of the ear unremarkable. SKIN: No active rash noted on the face.  No results found for any visits on 02/02/24.      Assessment & Plan:   Watery eyes Assessment & Plan: right nasolacrimal duct obstruction (dacryostenosis) - The presentation of unilateral eye pain located inferiorly, intermittent tearing, and difficulty opening the eye is suspicious for a blocked tear duct. This may be related to underlying allergies and associated inflammation. Examination is benign today. No signs of acute infection or glaucoma. - Use warm compresses on the affected area and gently massage in a counter-clockwise direction 2-3 times daily for 5-10 minutes. - Start Flonase  (fluticasone ) nasal spray, 2 sprays in each nostril once daily. Advised to continue for at least 2 weeks. Can use generic fluticasone , Nasonex , or Nasacort. - If symptoms do not improve after 2 weeks, will place a referral to an ophthalmologist. - Advised to seek immediate medical attention for any acute vision changes or severe pain, especially pain behind the eye. - Follow up is scheduled  for late December for other matters. Can follow up sooner regarding the eye if needed.      No follow-ups on file.  Toribio MARLA Slain, MD

## 2024-02-02 NOTE — Patient Instructions (Signed)
 It was nice to see you today,  We addressed the following topics today: -I think your tear duct gland is stenosed or clogged.  I would like you to use a warm compress in massage the area gently in a counterclockwise fashion a few times a day - I just would like you to get Flonase /fluticasone  nasal spray or other corticosteroid nasal spray and spray 2 sprays in each nostril once a day for at least 2 weeks. - If it is not getting better after that 2-week period let me know and I will send in a referral to the ophthalmologist. - If it anytime you get acute severe pain or any vision changes let us  know or seek medical attention at the emergency department.  Have a great day,  Rolan Slain, MD

## 2024-02-03 DIAGNOSIS — H04203 Unspecified epiphora, bilateral lacrimal glands: Secondary | ICD-10-CM | POA: Insufficient documentation

## 2024-02-03 NOTE — Assessment & Plan Note (Signed)
 right nasolacrimal duct obstruction (dacryostenosis) - The presentation of unilateral eye pain located inferiorly, intermittent tearing, and difficulty opening the eye is suspicious for a blocked tear duct. This may be related to underlying allergies and associated inflammation. Examination is benign today. No signs of acute infection or glaucoma. - Use warm compresses on the affected area and gently massage in a counter-clockwise direction 2-3 times daily for 5-10 minutes. - Start Flonase  (fluticasone ) nasal spray, 2 sprays in each nostril once daily. Advised to continue for at least 2 weeks. Can use generic fluticasone , Nasonex , or Nasacort. - If symptoms do not improve after 2 weeks, will place a referral to an ophthalmologist. - Advised to seek immediate medical attention for any acute vision changes or severe pain, especially pain behind the eye. - Follow up is scheduled for late December for other matters. Can follow up sooner regarding the eye if needed.

## 2024-02-13 DIAGNOSIS — I10 Essential (primary) hypertension: Secondary | ICD-10-CM | POA: Diagnosis not present

## 2024-02-13 DIAGNOSIS — Z008 Encounter for other general examination: Secondary | ICD-10-CM | POA: Diagnosis not present

## 2024-02-13 DIAGNOSIS — R7303 Prediabetes: Secondary | ICD-10-CM | POA: Diagnosis not present

## 2024-02-13 DIAGNOSIS — J45909 Unspecified asthma, uncomplicated: Secondary | ICD-10-CM | POA: Diagnosis not present

## 2024-02-13 DIAGNOSIS — Z6838 Body mass index (BMI) 38.0-38.9, adult: Secondary | ICD-10-CM | POA: Diagnosis not present

## 2024-02-13 DIAGNOSIS — E785 Hyperlipidemia, unspecified: Secondary | ICD-10-CM | POA: Diagnosis not present

## 2024-04-10 ENCOUNTER — Ambulatory Visit: Payer: Self-pay

## 2024-04-10 NOTE — Telephone Encounter (Signed)
 FYI Only or Action Required?: FYI only for provider: referred pt to urgent care.  Patient was last seen in primary care on 02/02/2024 by Chandra Toribio POUR, MD.  Called Nurse Triage reporting Cough.  Symptoms began x 10 days ago.  Interventions attempted: Nothing.  Symptoms are: gradually worsening.  Triage Disposition: See Physician Within 24 Hours  Patient/caregiver understands and will follow disposition?: Yes    Copied from CRM [id number]. Topic: Clinical - Red Word Triage >> Apr 10, 2024  9:58 AM [place] MATSU wrote: Red Word that prompted transfer to Nurse Triage: patient called in regarding worsening cough and congestion not getting better been over 10 days. Reason for Disposition  SEVERE coughing spells (e.g., whooping sound after coughing, vomiting after coughing)  Answer Assessment - Initial Assessment Questions 1. ONSET: When did the cough begin?      X 10 days 2. SEVERITY: How bad is the cough today?      moderate 3. SPUTUM: Describe the color of your sputum (e.g., none, dry cough; clear, white, yellow, green)     unsure 4. HEMOPTYSIS: Are you coughing up any blood? If Yes, ask: How much? (e.g., flecks, streaks, tablespoons, etc.)     no 5. DIFFICULTY BREATHING: Are you having difficulty breathing? If Yes, ask: How bad is it? (e.g., mild, moderate, severe)      SOB w/exertion  6. FEVER: Do you have a fever? If Yes, ask: What is your temperature, how was it measured, and when did it start?     no 7. CARDIAC HISTORY: Do you have any history of heart disease? (e.g., heart attack, congestive heart failure)      no 8. LUNG HISTORY: Do you have any history of lung disease?  (e.g., pulmonary embolus, asthma, emphysema)     asthma 9. PE RISK FACTORS: Do you have a history of blood clots? (or: recent major surgery, recent prolonged travel, bedridden)     no 10. OTHER SYMPTOMS: Do you have any other symptoms? (e.g., runny nose, wheezing, chest pain)        Wheezing, chest tightness,  11. PREGNANCY: Is there any chance you are pregnant? When was your last menstrual period?       na 12. TRAVEL: Have you traveled out of the country in the last month? (e.g., travel history, exposures)       Na  Offered pt appt: pt refused due to no openings in [place] area: referred pt to urgent care: pt stated will do walk in today  Protocols used: Cough - Acute Productive-A-AH

## 2024-04-16 ENCOUNTER — Ambulatory Visit: Payer: Self-pay

## 2024-04-16 NOTE — Telephone Encounter (Signed)
 FYI Only or Action Required?: Action required by provider: request for appointment.  Patient was last seen in primary care on 02/02/2024 by Chandra Toribio POUR, MD.  Called Nurse Triage reporting Cough.  Symptoms began a week ago.  Interventions attempted: Nothing.  Symptoms are: unchanged.  Triage Disposition: See Physician Within 24 Hours  Patient/caregiver understands and will follow disposition?: No, wishes to speak with PCP   Copied from CRM #[id number]. Topic: Clinical - Red Word Triage >> Apr 16, 2024 10:08 AM Rea ORN wrote: Red Word that prompted transfer to Nurse Triage: lingering cough Reason for Disposition  [1] Continuous (nonstop) coughing interferes with work or school AND [2] no improvement using cough treatment per Care Advice  Answer Assessment - Initial Assessment Questions No available appts with pcp. Offered alt prov, patient declines and requests work in visit today.  Advised UC today or ED if symptoms worsen.  1. ONSET: When did the cough begin?      3 wks; ongoing cough,  intermittent sob 2. SEVERITY: How bad is the cough today?      moderate 3. SPUTUM: Describe the color of your sputum (e.g., none, dry cough; clear, white, yellow, green)     clear 4. HEMOPTYSIS: Are you coughing up any blood? If Yes, ask: How much? (e.g., flecks, streaks, tablespoons, etc.)     no 5. DIFFICULTY BREATHING: Are you having difficulty breathing? If Yes, ask: How bad is it? (e.g., mild, moderate, severe)      mild 6. FEVER: Do you have a fever? If Yes, ask: What is your temperature, how was it measured, and when did it start?     Denies fver, chills, n/v  7. CARDIAC HISTORY: Do you have any history of heart disease? (e.g., heart attack, congestive heart failure)      no 8. LUNG HISTORY: Do you have any history of lung disease?  (e.g., pulmonary embolus, asthma, emphysema)     asthma 9. PE RISK FACTORS: Do you have a history of blood clots? (or: recent major  surgery, recent prolonged travel, bedridden)     no 10. OTHER SYMPTOMS: Do you have any other symptoms? (e.g., runny nose, wheezing, chest pain)       Denies chest pain, 12. TRAVEL: Have you traveled out of the country in the last month? (e.g., travel history, exposures)       no  Protocols used: Cough - Acute Productive-A-AH

## 2024-04-16 NOTE — Telephone Encounter (Signed)
 Can you call pt to try to schedule her sometime later this week?

## 2024-04-17 ENCOUNTER — Ambulatory Visit: Payer: Self-pay | Admitting: Family Medicine

## 2024-04-17 ENCOUNTER — Encounter: Payer: Self-pay | Admitting: Family Medicine

## 2024-04-17 VITALS — BP 106/70 | HR 66 | Ht 65.0 in | Wt 237.8 lb

## 2024-04-17 DIAGNOSIS — J988 Other specified respiratory disorders: Secondary | ICD-10-CM | POA: Diagnosis not present

## 2024-04-17 MED ORDER — AZITHROMYCIN 250 MG PO TABS: ORAL_TABLET | ORAL | 0 refills | Status: AC

## 2024-04-17 NOTE — Progress Notes (Unsigned)
° °  Acute Office Visit  Subjective:     Patient ID: Lori Newton, female    DOB: 01-12-1956, 68 y.o.   MRN: [id number]  Chief Complaint  Patient presents with   Cough    HPI Patient is in today for ***  Subjective - Cough for approximately 3 weeks. Reports it is not improving. Describes it as a dry cough during the day and productive of more mucus at night. - Preceded by a cold two weeks prior to cough onset. - Reports recent significant life stressors including the death of his mother (age 57, expected, in hospice) and sister (unexpected). - Some mild persistent rhinorrhea. - No sore throat mentioned. - Reports taking vitamin D , which provided some relief.  Medications Singulair  (montelukast ) as prescribed. Albuterol  inhaler used twice in the past week, reports it may be expired and does not like the side effects. Does not take any over-the-counter antihistamines like Claritin or Zyrtec. Previously tried a prescribed over-the-counter nasal spray but discontinued due to eye twitching.  PMH, PSH, FH, Social Hx PMH: Asthma. Allergies: No known drug allergies. FH: Mother deceased at 21. Sister recently deceased. Social Hx: Reports significant recent stress due to family deaths.  ROS Constitutional: No fevers, chills mentioned. Respiratory: Positive for cough, phlegm production (worse at night). No wheezing reported by patient. Used rescue inhaler twice in the past week. HEENT: Positive for mild rhinorrhea. No sore throat. Negative for eye symptoms after discontinuing nasal spray.  Objective PULM: No wheezing on auscultation. Decreased air movement noted in the left lower lung field, suggestive of consolidation.  Assessment and Plan Post-viral cough / Acute Bronchitis - Presents with a 3-week history of cough, initially dry and now productive at night, following a cold. Examination is notable for decreased breath sounds in the left base without wheezing. While likely viral,  a superimposed bacterial process cannot be ruled out. - Start doxycycline. - Recommended over-the-counter cough suppressants and expectorants. - Advised on supportive care measures.     ROS      Objective:    BP 106/70   Pulse 66   Ht 5' 5 (1.651 m)   Wt 237 lb 12.8 oz (107.9 kg)   SpO2 97%   BMI 39.57 kg/m  {Vitals History (Optional):23777}  Physical Exam  No results found for any visits on 04/17/24.      Assessment & Plan:   There are no diagnoses linked to this encounter.   No follow-ups on file.  Toribio MARLA Slain, MD

## 2024-04-17 NOTE — Patient Instructions (Signed)
 It was nice to see you today,  We addressed the following topics today: - I am prescribing an antibiotic, azithromycin, for you. - For your cough, you can use over-the-counter medications. Look for ingredients like Mucinex (guaifenesin) to help break up mucus and DM (dextromethorphan) to suppress the cough. - Be careful with nighttime formulations like Nyquil as they can cause drowsiness. You can take the non-drowsy formulas during the day. - For any throat soreness from coughing, you can use lozenges like Cepacol. - For your runny nose, you can use a nasal saline spray or Afrin nasal spray. A Neti pot is also an option, but you must ensure it is cleaned very well to prevent infection. - The cough itself may linger for several weeks. This is not concerning as long as it is not getting worse. If your symptoms worsen, please follow up.  Have a great day,  Rolan Slain, MD

## 2024-04-20 DIAGNOSIS — J988 Other specified respiratory disorders: Secondary | ICD-10-CM | POA: Insufficient documentation

## 2024-04-20 NOTE — Assessment & Plan Note (Addendum)
-   Presents with a 3-week history of cough, initially dry and now productive at night, following a cold. Examination is notable for decreased breath sounds in the left base without wheezing. While likely viral, a superimposed bacterial process cannot be ruled out. - Start azithromycin . - Recommended over-the-counter cough suppressants and expectorants. - Advised on supportive care measures.

## 2024-04-21 ENCOUNTER — Other Ambulatory Visit: Payer: Self-pay | Admitting: Family Medicine

## 2024-04-21 DIAGNOSIS — E66812 Obesity, class 2: Secondary | ICD-10-CM

## 2024-05-06 ENCOUNTER — Other Ambulatory Visit

## 2024-05-06 DIAGNOSIS — E782 Mixed hyperlipidemia: Secondary | ICD-10-CM

## 2024-05-06 DIAGNOSIS — E559 Vitamin D deficiency, unspecified: Secondary | ICD-10-CM

## 2024-05-06 DIAGNOSIS — Z6839 Body mass index (BMI) 39.0-39.9, adult: Secondary | ICD-10-CM

## 2024-05-06 DIAGNOSIS — I10 Essential (primary) hypertension: Secondary | ICD-10-CM

## 2024-05-07 ENCOUNTER — Ambulatory Visit: Payer: Self-pay | Admitting: Family Medicine

## 2024-05-07 LAB — COMPREHENSIVE METABOLIC PANEL WITH GFR
ALT: 20 IU/L (ref 0–32)
AST: 22 IU/L (ref 0–40)
Albumin: 4.3 g/dL (ref 3.9–4.9)
Alkaline Phosphatase: 75 IU/L (ref 49–135)
BUN/Creatinine Ratio: 15 (ref 12–28)
BUN: 12 mg/dL (ref 8–27)
Bilirubin Total: 0.4 mg/dL (ref 0.0–1.2)
CO2: 25 mmol/L (ref 20–29)
Calcium: 9.6 mg/dL (ref 8.7–10.3)
Chloride: 101 mmol/L (ref 96–106)
Creatinine, Ser: 0.81 mg/dL (ref 0.57–1.00)
Globulin, Total: 2.8 g/dL (ref 1.5–4.5)
Glucose: 98 mg/dL (ref 70–99)
Potassium: 4.1 mmol/L (ref 3.5–5.2)
Sodium: 141 mmol/L (ref 134–144)
Total Protein: 7.1 g/dL (ref 6.0–8.5)
eGFR: 80 mL/min/1.73

## 2024-05-07 LAB — HEMOGLOBIN A1C
Est. average glucose Bld gHb Est-mCnc: 120 mg/dL
Hgb A1c MFr Bld: 5.8 % — ABNORMAL HIGH (ref 4.8–5.6)

## 2024-05-07 LAB — LIPID PANEL
Chol/HDL Ratio: 2.9 ratio (ref 0.0–4.4)
Cholesterol, Total: 180 mg/dL (ref 100–199)
HDL: 62 mg/dL
LDL Chol Calc (NIH): 92 mg/dL (ref 0–99)
Triglycerides: 149 mg/dL (ref 0–149)
VLDL Cholesterol Cal: 26 mg/dL (ref 5–40)

## 2024-05-07 LAB — VITAMIN D 25 HYDROXY (VIT D DEFICIENCY, FRACTURES): Vit D, 25-Hydroxy: 107 ng/mL — ABNORMAL HIGH (ref 30.0–100.0)

## 2024-05-11 ENCOUNTER — Other Ambulatory Visit: Payer: Self-pay | Admitting: Sports Medicine

## 2024-05-11 DIAGNOSIS — J4520 Mild intermittent asthma, uncomplicated: Secondary | ICD-10-CM

## 2024-05-13 ENCOUNTER — Encounter: Payer: Self-pay | Admitting: Family Medicine

## 2024-05-13 ENCOUNTER — Ambulatory Visit: Admitting: Family Medicine

## 2024-05-13 VITALS — BP 97/63 | HR 62 | Ht 65.0 in | Wt 241.0 lb

## 2024-05-13 DIAGNOSIS — R7303 Prediabetes: Secondary | ICD-10-CM | POA: Insufficient documentation

## 2024-05-13 DIAGNOSIS — E785 Hyperlipidemia, unspecified: Secondary | ICD-10-CM | POA: Diagnosis not present

## 2024-05-13 DIAGNOSIS — Z0000 Encounter for general adult medical examination without abnormal findings: Secondary | ICD-10-CM

## 2024-05-13 DIAGNOSIS — E1169 Type 2 diabetes mellitus with other specified complication: Secondary | ICD-10-CM | POA: Diagnosis not present

## 2024-05-13 DIAGNOSIS — E673 Hypervitaminosis D: Secondary | ICD-10-CM

## 2024-05-13 DIAGNOSIS — I10 Essential (primary) hypertension: Secondary | ICD-10-CM | POA: Diagnosis not present

## 2024-05-13 DIAGNOSIS — J4520 Mild intermittent asthma, uncomplicated: Secondary | ICD-10-CM

## 2024-05-13 MED ORDER — MONTELUKAST SODIUM 10 MG PO TABS: 10.0000 mg | ORAL_TABLET | Freq: Every day | ORAL | 3 refills | Status: AC

## 2024-05-13 MED ORDER — AMLODIPINE BESYLATE 10 MG PO TABS: 10.0000 mg | ORAL_TABLET | Freq: Every day | ORAL | 3 refills | Status: AC

## 2024-05-13 MED ORDER — ALBUTEROL SULFATE HFA 108 (90 BASE) MCG/ACT IN AERS: 2.0000 | INHALATION_SPRAY | Freq: Four times a day (QID) | RESPIRATORY_TRACT | 6 refills | Status: AC | PRN

## 2024-05-13 NOTE — Progress Notes (Signed)
 "  Established Patient Office Visit  Subjective   Patient ID: Lori Newton, female    DOB: 1956-01-30  Age: 69 y.o. MRN: [id number]  Chief Complaint  Patient presents with   Annual Exam     History of Present Illness   Lori Newton is a 69 year old female with asthma who presents for a physical exam and management of her asthma symptoms.  She experiences a persistent cough that is improving but still present. Her asthma symptoms, particularly chest tightness, are managed by taking 10,000 IU of vitamin D  daily, which she finds helps her breathe better. Attempts to reduce the dose to 5,000 IU resulted in a return of chest tightness. She does not use a daily asthma inhaler due to side effects such as dizziness and eye irritation from previous inhalers. She has not used albuterol  recently. Her current medications include Singulair  and amlodipine .  Recent lab results show an A1c of 5.8, indicating a borderline normal to prediabetic range. Her vitamin D  levels are elevated at 107, above the normal upper limit of 100, but her calcium  levels are normal. She is also taking vitamin K2, CoQ10, fish oil, and occasionally vitamin B12. She takes vitamin B6 daily, which she believes helps with her asthma.  She practices intermittent fasting, eating between 1 PM and 7 PM, and is trying to lose weight, aiming to lose 70 pounds. She avoids bread but struggles with sweets, and she experiences swelling in her hands after eating pizza.  She reports numbness in the last three toes of her right foot, which she attributes to cold feet. She has flat feet and is considering weight loss and orthotics to address this issue. She has not experienced burning or pain associated with the numbness.  Her family history includes her sister and father having had shingles, with her sister describing intense itching and her father experiencing pain on his torso.          The 10-year ASCVD risk score (Arnett DK, et  al., 2019) is: 8.3%  Health Maintenance Due  Topic Date Due   Medicare Annual Wellness (AWV)  Never done   Hepatitis C Screening  Never done   COVID-19 Vaccine (1 - 2025-26 season) Never done      Objective:     BP 97/63   Pulse 62   Ht 5' 5 (1.651 m)   Wt 241 lb (109.3 kg)   SpO2 98%   BMI 40.10 kg/m    Physical Exam   Gen: alert, oriented HEENT: perrla, eomi, mmm CV: rrr, no murmur Pulm: lctab. No wheeze or crackles.  GI: soft, nbs.  Nontender to palpation. Large pannus MSK: strength equal b/l. Normal gait Ext: no pedal edema Skin: warm and dry, no rashes Psych: pleasant affect.  Spontaneous speech       No results found for any visits on 05/13/24.      Assessment & Plan:   Physical exam, annual  Hypertension, unspecified type Assessment & Plan: Blood pressure well-controlled with amlodipine . - Continue amlodipine .  Orders: -     amLODIPine  Besylate; Take 1 tablet (10 mg total) by mouth daily.  Dispense: 90 tablet; Refill: 3  Mild intermittent asthma without complication Assessment & Plan: Asthma managed with vitamin D ; emergency inhaler needed. - Check vitamin D  levels more frequently. - Try taking vitamin D  every other day to reduce dose. - Sent refill for emergency inhaler. - Continue Singulair .  Orders: -     Montelukast  Sodium; Take 1 tablet (  10 mg total) by mouth at bedtime.  Dispense: 90 tablet; Refill: 3 -     Albuterol  Sulfate HFA; Inhale 2 puffs into the lungs every 6 (six) hours as needed. wheezing  Dispense: 1 each; Refill: 6  Prediabetes Assessment & Plan: A1c 5.8, stable from last year. Weight loss and dietary changes recommended. - Encouraged weight loss and dietary modifications.   Morbid obesity (HCC) Assessment & Plan: Weight loss through intermittent fasting and exercise expected to improve health. Bmi 40.1. Sweets intake may hinder progress. - Encouraged continuation of intermittent fasting and cardiovascular  exercise. - Advised reducing sweets intake to once a week. - Recommended considering Tai Chi or low-impact exercises.   Healthcare maintenance Assessment & Plan: Discussed vaccinations and screenings. She is hesitant about mammograms and Cologuard. - declined shingles, pneumococcal, and flu vaccinations - Discussed bone density scan for osteoporosis screening. Declined at this time.  - Encouraged consideration of mammogram and Cologuard for early detection of breast cancer and colorectal cancer.  - will try to perform alternative breast cancer screening to mammogram   Hyperlipidemia associated with type 2 diabetes mellitus (HCC) Assessment & Plan: Ldl under 100.  Continue to monitor.           Return in about 1 year (around 05/13/2025) for physical.    Toribio MARLA Slain, MD  "

## 2024-05-13 NOTE — Assessment & Plan Note (Signed)
 Weight loss through intermittent fasting and exercise expected to improve health. Bmi 40.1. Sweets intake may hinder progress. - Encouraged continuation of intermittent fasting and cardiovascular exercise. - Advised reducing sweets intake to once a week. - Recommended considering Tai Chi or low-impact exercises.

## 2024-05-13 NOTE — Assessment & Plan Note (Signed)
 Discussed vaccinations and screenings. She is hesitant about mammograms and Cologuard. - declined shingles, pneumococcal, and flu vaccinations - Discussed bone density scan for osteoporosis screening. Declined at this time.  - Encouraged consideration of mammogram and Cologuard for early detection of breast cancer and colorectal cancer.  - will try to perform alternative breast cancer screening to mammogram

## 2024-05-13 NOTE — Assessment & Plan Note (Signed)
 Asthma managed with vitamin D ; emergency inhaler needed. - Check vitamin D  levels more frequently. - Try taking vitamin D  every other day to reduce dose. - Sent refill for emergency inhaler. - Continue Singulair .

## 2024-05-13 NOTE — Patient Instructions (Signed)
" °  VISIT SUMMARY: Today, we reviewed your asthma management, blood pressure, prediabetes, and weight loss goals. We also discussed general health maintenance, including vaccinations and screenings.  YOUR PLAN: MILD INTERMITTENT ASTHMA: Your asthma is currently managed with vitamin D , but you need an emergency inhaler on hand. -Try taking vitamin D  every other day to reduce the dose. Your vit d level was slightly elevated. -A refill for your emergency inhaler has been sent. -Continue taking Singulair  as prescribed.  ESSENTIAL HYPERTENSION: Your blood pressure is well-controlled with your current medication. -Continue taking amlodipine  as prescribed.  PREDIABETES: Your A1c level is 5.8, which is stable but indicates a borderline normal to prediabetic range. -Continue with weight loss and dietary modifications.  OBESITY: You are working on weight loss through intermittent fasting and exercise, but sweets intake may hinder your progress. -Continue intermittent fasting and cardiovascular exercise. -Reduce sweets intake to once a week. -Consider Tai Chi or low-impact exercises.  GENERAL HEALTH MAINTENANCE: We discussed vaccinations and screenings to maintain your overall health. -Consider getting a bone density scan for osteoporosis screening. - I will try to send in either an mri or ultrasound order for breast cancer screening.  It may not be covered by your insurance.      "

## 2024-05-13 NOTE — Assessment & Plan Note (Signed)
 Blood pressure well-controlled with amlodipine . - Continue amlodipine .

## 2024-05-13 NOTE — Assessment & Plan Note (Signed)
 A1c 5.8, stable from last year. Weight loss and dietary changes recommended. - Encouraged weight loss and dietary modifications.

## 2024-05-14 NOTE — Assessment & Plan Note (Signed)
 Ldl under 100.  Continue to monitor.

## 2024-11-11 ENCOUNTER — Other Ambulatory Visit

## 2025-05-07 ENCOUNTER — Other Ambulatory Visit

## 2025-05-14 ENCOUNTER — Encounter: Admitting: Family Medicine
